# Patient Record
Sex: Female | Born: 1970 | Race: White | Hispanic: No | Marital: Married | Smoking: Current every day smoker
Health system: Southern US, Community
[De-identification: ages and names within clinical notes are randomized; demographics above are authoritative.]

## PROBLEM LIST (undated history)

## (undated) DIAGNOSIS — Z9889 Other specified postprocedural states: Secondary | ICD-10-CM

## (undated) DIAGNOSIS — M797 Fibromyalgia: Secondary | ICD-10-CM

## (undated) DIAGNOSIS — D649 Anemia, unspecified: Secondary | ICD-10-CM

## (undated) DIAGNOSIS — F32A Depression, unspecified: Secondary | ICD-10-CM

## (undated) DIAGNOSIS — B1920 Unspecified viral hepatitis C without hepatic coma: Secondary | ICD-10-CM

## (undated) DIAGNOSIS — J449 Chronic obstructive pulmonary disease, unspecified: Secondary | ICD-10-CM

## (undated) DIAGNOSIS — I499 Cardiac arrhythmia, unspecified: Secondary | ICD-10-CM

## (undated) DIAGNOSIS — J189 Pneumonia, unspecified organism: Secondary | ICD-10-CM

## (undated) DIAGNOSIS — R112 Nausea with vomiting, unspecified: Secondary | ICD-10-CM

## (undated) DIAGNOSIS — E271 Primary adrenocortical insufficiency: Secondary | ICD-10-CM

## (undated) DIAGNOSIS — R519 Headache, unspecified: Secondary | ICD-10-CM

## (undated) DIAGNOSIS — F419 Anxiety disorder, unspecified: Secondary | ICD-10-CM

## (undated) DIAGNOSIS — K219 Gastro-esophageal reflux disease without esophagitis: Secondary | ICD-10-CM

## (undated) HISTORY — PX: ORTHOPEDIC SURGERY: SHX850

## (undated) HISTORY — PX: TYMPANOSTOMY TUBE PLACEMENT: SHX32

## (undated) HISTORY — PX: CHOLECYSTECTOMY: SHX55

## (undated) HISTORY — DX: Unspecified viral hepatitis C without hepatic coma: B19.20

## (undated) HISTORY — PX: TUBAL LIGATION: SHX77

---

## 2004-02-20 ENCOUNTER — Emergency Department: Payer: Self-pay | Admitting: Emergency Medicine

## 2004-02-20 ENCOUNTER — Other Ambulatory Visit: Payer: Self-pay

## 2004-08-09 ENCOUNTER — Emergency Department: Payer: Self-pay | Admitting: Emergency Medicine

## 2005-11-01 ENCOUNTER — Ambulatory Visit: Payer: Self-pay | Admitting: Physician Assistant

## 2005-12-08 ENCOUNTER — Ambulatory Visit: Payer: Self-pay | Admitting: Unknown Physician Specialty

## 2005-12-09 ENCOUNTER — Emergency Department: Payer: Self-pay | Admitting: Internal Medicine

## 2006-05-18 ENCOUNTER — Emergency Department: Payer: Self-pay | Admitting: Emergency Medicine

## 2006-07-12 ENCOUNTER — Emergency Department: Payer: Self-pay | Admitting: Emergency Medicine

## 2006-07-12 ENCOUNTER — Other Ambulatory Visit: Payer: Self-pay

## 2006-07-31 ENCOUNTER — Ambulatory Visit: Payer: Self-pay | Admitting: Gastroenterology

## 2006-09-23 ENCOUNTER — Emergency Department (HOSPITAL_COMMUNITY): Admission: EM | Admit: 2006-09-23 | Discharge: 2006-09-23 | Payer: Self-pay | Admitting: Emergency Medicine

## 2007-04-02 ENCOUNTER — Encounter: Admission: RE | Admit: 2007-04-02 | Discharge: 2007-04-02 | Payer: Self-pay

## 2007-06-24 ENCOUNTER — Emergency Department: Payer: Self-pay | Admitting: Emergency Medicine

## 2007-06-24 ENCOUNTER — Emergency Department (HOSPITAL_COMMUNITY): Admission: EM | Admit: 2007-06-24 | Discharge: 2007-06-24 | Payer: Self-pay | Admitting: Emergency Medicine

## 2007-06-27 ENCOUNTER — Emergency Department: Payer: Self-pay | Admitting: Emergency Medicine

## 2007-06-28 ENCOUNTER — Emergency Department: Payer: Self-pay | Admitting: Emergency Medicine

## 2007-07-02 ENCOUNTER — Emergency Department (HOSPITAL_COMMUNITY): Admission: EM | Admit: 2007-07-02 | Discharge: 2007-07-02 | Payer: Self-pay | Admitting: Emergency Medicine

## 2007-07-21 ENCOUNTER — Emergency Department: Payer: Self-pay | Admitting: Emergency Medicine

## 2007-10-05 ENCOUNTER — Emergency Department: Payer: Self-pay | Admitting: Emergency Medicine

## 2007-11-02 ENCOUNTER — Emergency Department: Payer: Self-pay | Admitting: Emergency Medicine

## 2007-12-06 ENCOUNTER — Other Ambulatory Visit: Payer: Self-pay

## 2007-12-06 ENCOUNTER — Emergency Department: Payer: Self-pay | Admitting: Emergency Medicine

## 2008-01-08 ENCOUNTER — Emergency Department: Payer: Self-pay | Admitting: Emergency Medicine

## 2008-01-29 ENCOUNTER — Emergency Department (HOSPITAL_COMMUNITY): Admission: EM | Admit: 2008-01-29 | Discharge: 2008-01-30 | Payer: Self-pay | Admitting: Emergency Medicine

## 2008-01-31 ENCOUNTER — Emergency Department (HOSPITAL_COMMUNITY): Admission: EM | Admit: 2008-01-31 | Discharge: 2008-02-01 | Payer: Self-pay | Admitting: Emergency Medicine

## 2008-02-25 ENCOUNTER — Emergency Department (HOSPITAL_COMMUNITY): Admission: EM | Admit: 2008-02-25 | Discharge: 2008-02-26 | Payer: Self-pay | Admitting: Emergency Medicine

## 2008-02-27 ENCOUNTER — Emergency Department (HOSPITAL_COMMUNITY): Admission: EM | Admit: 2008-02-27 | Discharge: 2008-02-27 | Payer: Self-pay | Admitting: Emergency Medicine

## 2008-03-01 ENCOUNTER — Emergency Department: Payer: Self-pay | Admitting: Emergency Medicine

## 2008-03-26 ENCOUNTER — Emergency Department (HOSPITAL_COMMUNITY): Admission: EM | Admit: 2008-03-26 | Discharge: 2008-03-26 | Payer: Self-pay | Admitting: Emergency Medicine

## 2008-04-10 ENCOUNTER — Ambulatory Visit: Payer: Self-pay | Admitting: Internal Medicine

## 2008-04-10 DIAGNOSIS — I080 Rheumatic disorders of both mitral and aortic valves: Secondary | ICD-10-CM | POA: Insufficient documentation

## 2008-04-10 DIAGNOSIS — J449 Chronic obstructive pulmonary disease, unspecified: Secondary | ICD-10-CM | POA: Insufficient documentation

## 2008-04-10 DIAGNOSIS — E785 Hyperlipidemia, unspecified: Secondary | ICD-10-CM | POA: Insufficient documentation

## 2008-04-10 DIAGNOSIS — J45909 Unspecified asthma, uncomplicated: Secondary | ICD-10-CM | POA: Insufficient documentation

## 2008-04-10 DIAGNOSIS — IMO0001 Reserved for inherently not codable concepts without codable children: Secondary | ICD-10-CM | POA: Insufficient documentation

## 2008-04-10 DIAGNOSIS — R079 Chest pain, unspecified: Secondary | ICD-10-CM | POA: Insufficient documentation

## 2008-04-10 DIAGNOSIS — M797 Fibromyalgia: Secondary | ICD-10-CM | POA: Insufficient documentation

## 2008-05-30 ENCOUNTER — Ambulatory Visit: Payer: Self-pay | Admitting: Internal Medicine

## 2008-05-30 DIAGNOSIS — R609 Edema, unspecified: Secondary | ICD-10-CM | POA: Insufficient documentation

## 2008-07-31 ENCOUNTER — Emergency Department: Payer: Self-pay | Admitting: Unknown Physician Specialty

## 2008-08-01 ENCOUNTER — Emergency Department: Payer: Self-pay | Admitting: Emergency Medicine

## 2008-08-01 ENCOUNTER — Emergency Department (HOSPITAL_COMMUNITY): Admission: EM | Admit: 2008-08-01 | Discharge: 2008-08-02 | Payer: Self-pay | Admitting: Emergency Medicine

## 2008-08-19 ENCOUNTER — Ambulatory Visit: Payer: Self-pay | Admitting: Internal Medicine

## 2008-08-22 DIAGNOSIS — J209 Acute bronchitis, unspecified: Secondary | ICD-10-CM | POA: Insufficient documentation

## 2008-09-03 ENCOUNTER — Observation Stay: Payer: Self-pay | Admitting: *Deleted

## 2008-09-17 ENCOUNTER — Emergency Department: Payer: Self-pay | Admitting: Emergency Medicine

## 2008-10-06 ENCOUNTER — Observation Stay: Payer: Self-pay | Admitting: Internal Medicine

## 2008-10-11 ENCOUNTER — Ambulatory Visit: Payer: Self-pay | Admitting: Family Medicine

## 2008-12-15 ENCOUNTER — Emergency Department: Payer: Self-pay | Admitting: Emergency Medicine

## 2008-12-16 ENCOUNTER — Emergency Department: Payer: Self-pay | Admitting: Unknown Physician Specialty

## 2008-12-24 IMAGING — CR DG CHEST 2V
2 series · 2 of 2 positions shown · non-contrast
Comparison: 02/27/2008

CLINICAL DATA: Chest pain.  Wheezing.  Asthma.

CHEST - 2 VIEW

[w chest pa]
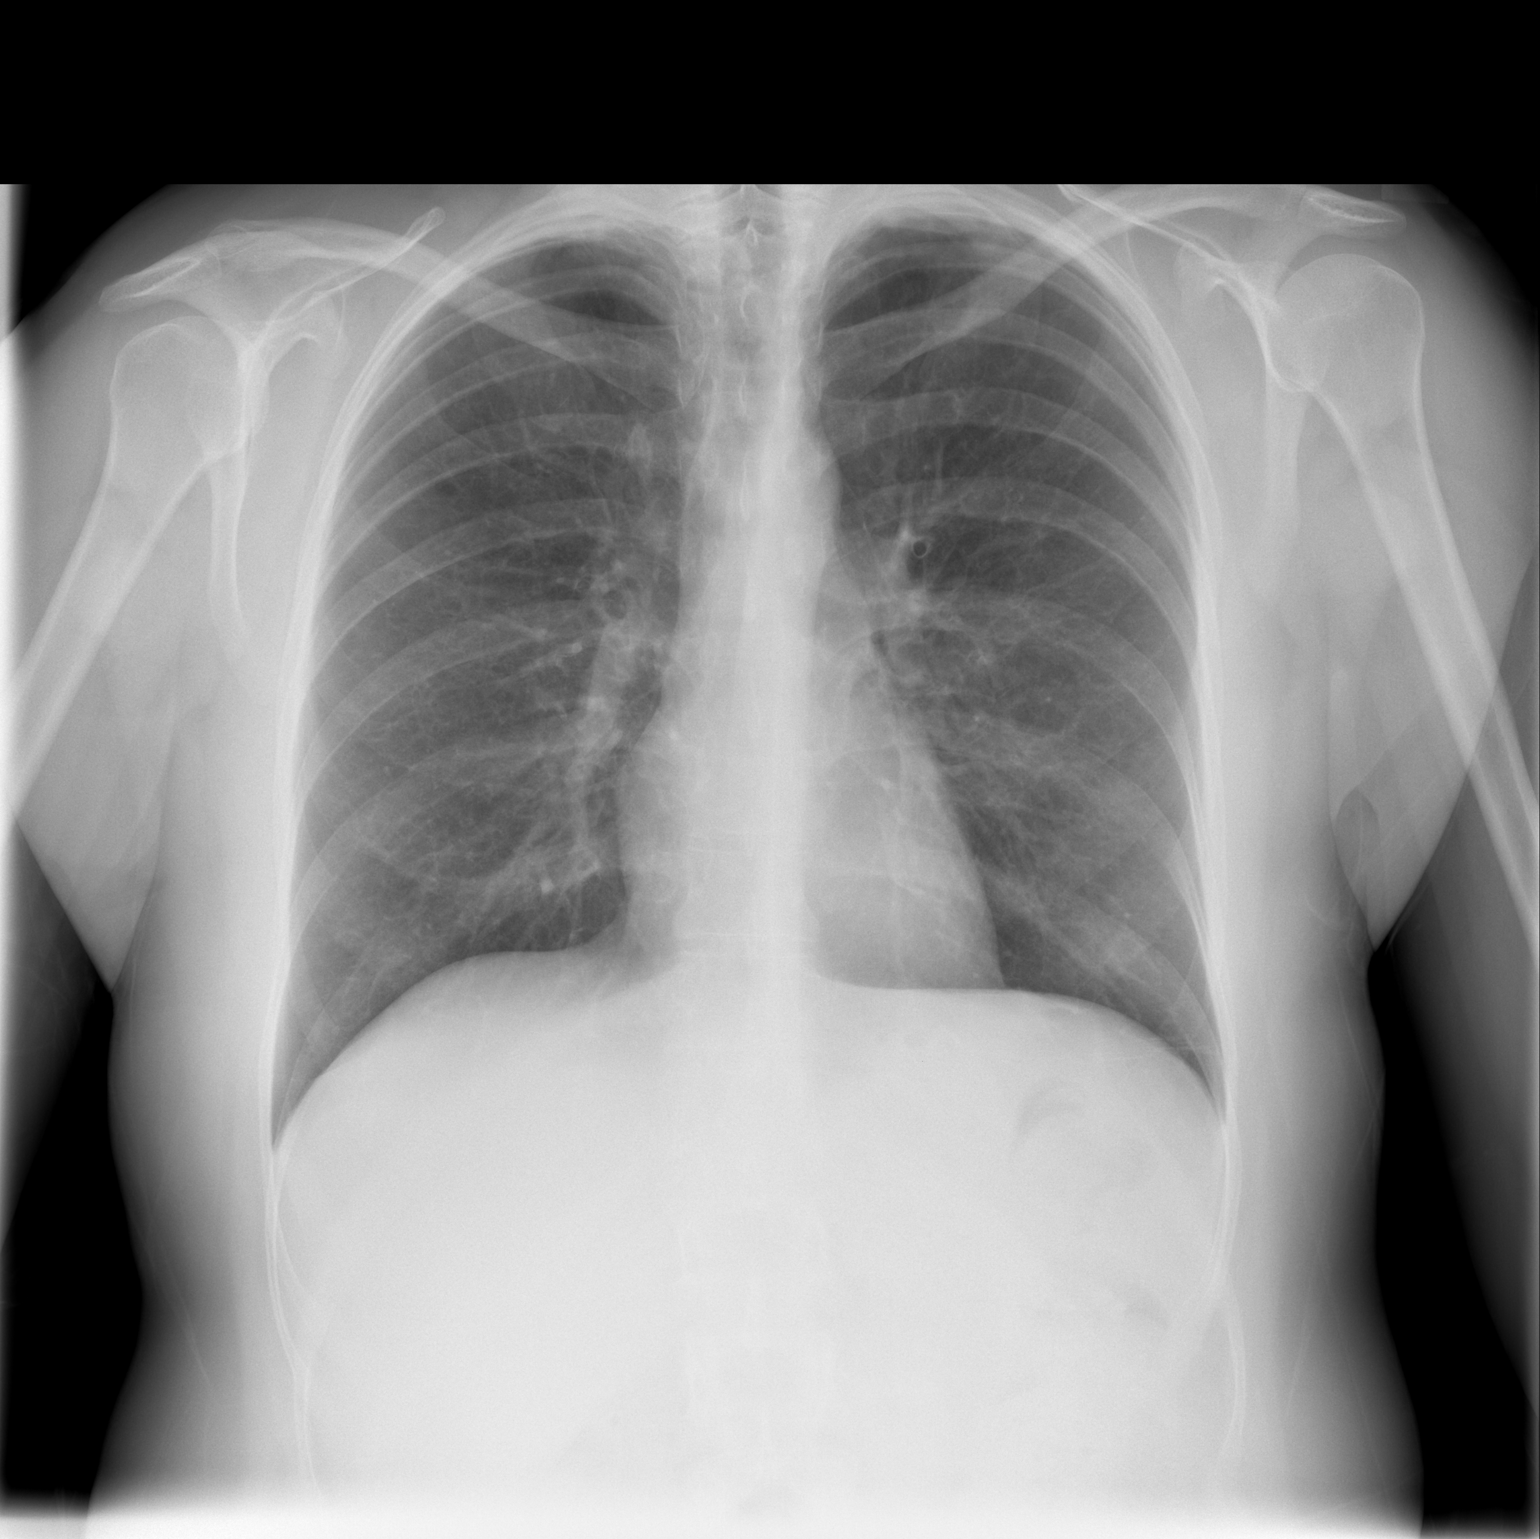

[w chest lat]
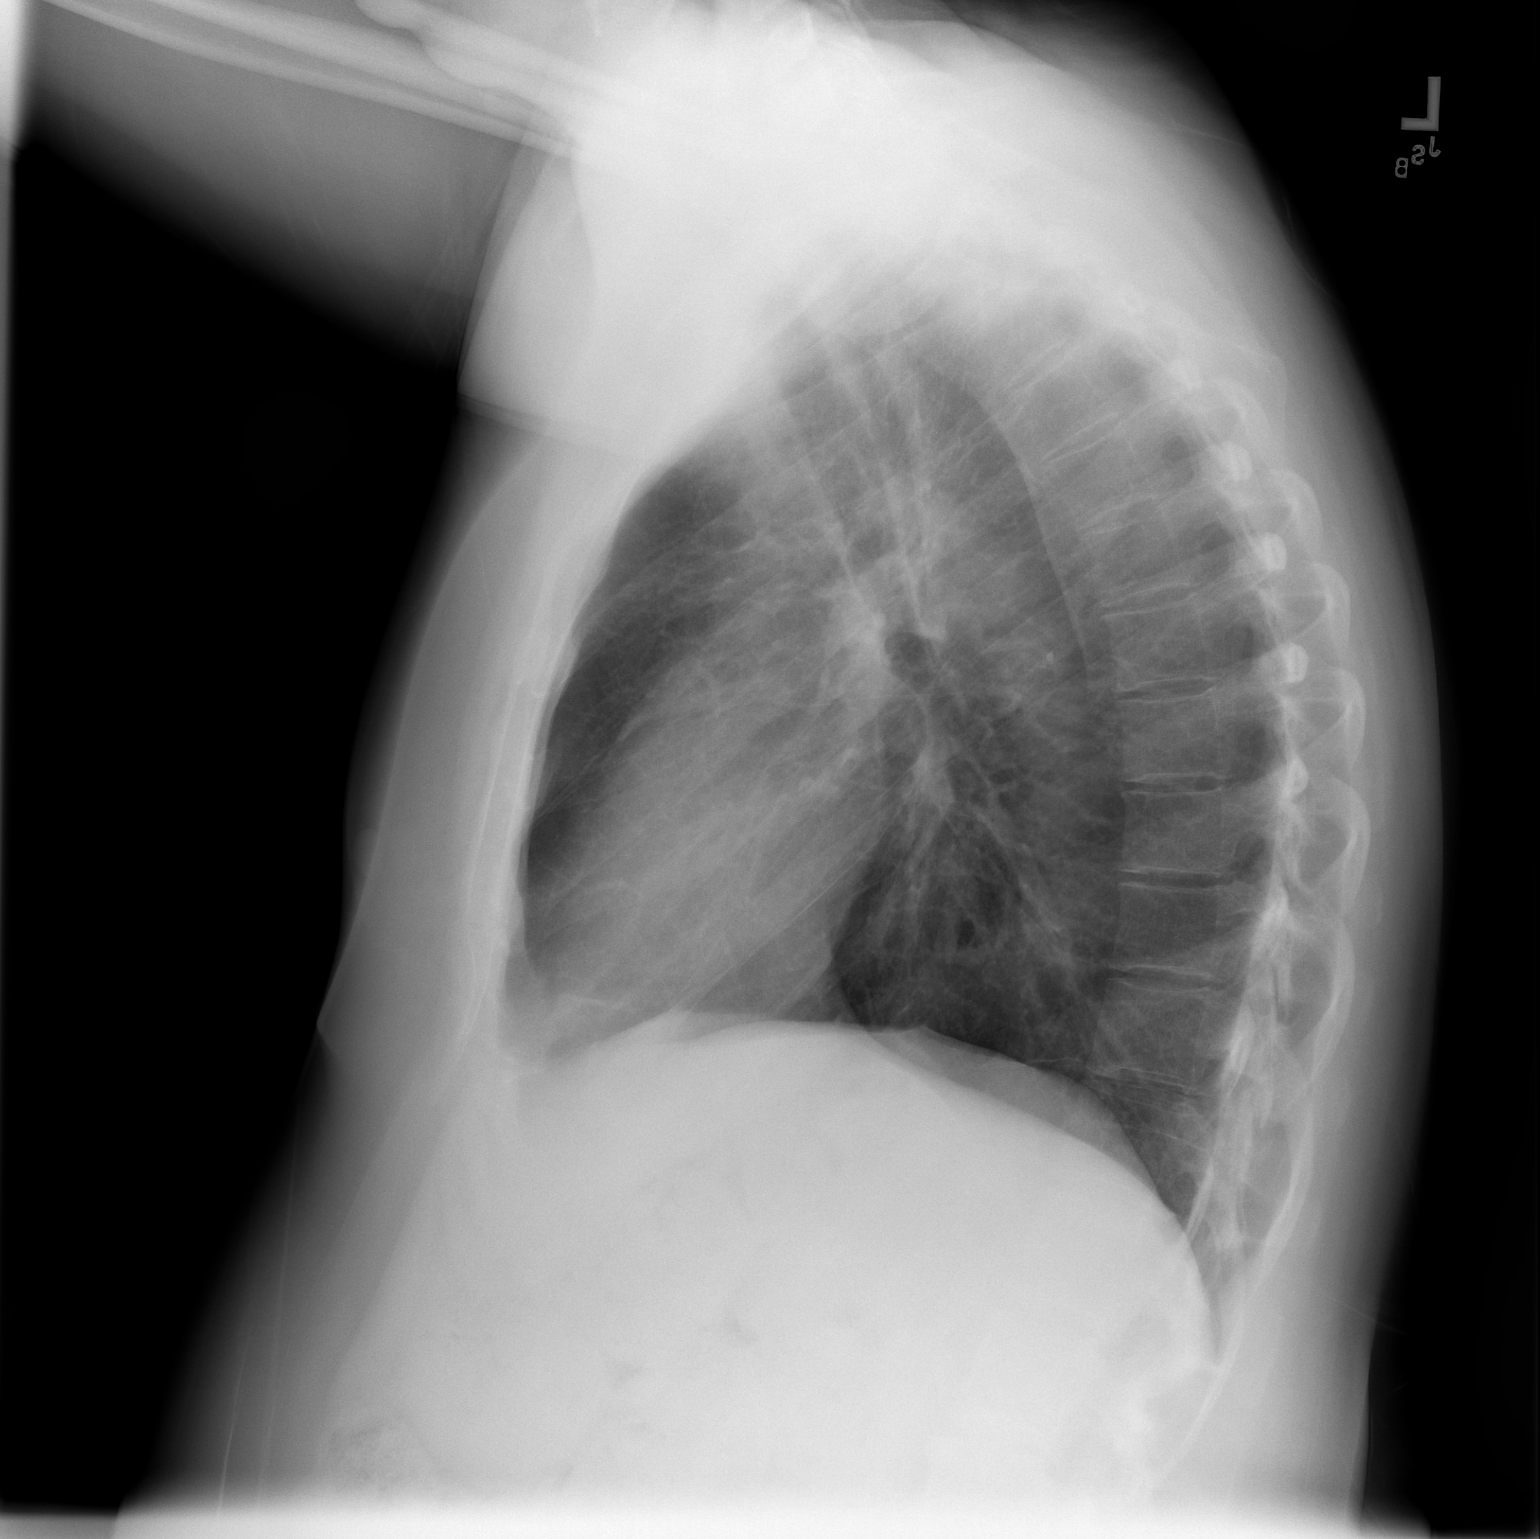

[2 of 2 positions shown; findings below may reference images not displayed]

FINDINGS: The heart size and mediastinal contours are within normal
limits.  Both lungs are clear.  The visualized skeletal structures
are unremarkable.
IMPRESSION: No active cardiopulmonary disease.

## 2009-01-15 ENCOUNTER — Emergency Department: Payer: Self-pay | Admitting: Emergency Medicine

## 2009-01-18 ENCOUNTER — Emergency Department: Payer: Self-pay | Admitting: Emergency Medicine

## 2009-01-20 ENCOUNTER — Ambulatory Visit: Payer: Self-pay | Admitting: General Surgery

## 2009-01-24 ENCOUNTER — Emergency Department: Payer: Self-pay | Admitting: Surgery

## 2009-02-17 ENCOUNTER — Inpatient Hospital Stay: Payer: Self-pay | Admitting: Psychiatry

## 2009-05-21 ENCOUNTER — Observation Stay: Payer: Self-pay | Admitting: Internal Medicine

## 2009-05-26 ENCOUNTER — Emergency Department: Payer: Self-pay | Admitting: Unknown Physician Specialty

## 2010-07-14 LAB — COMPREHENSIVE METABOLIC PANEL
ALT: 10 U/L (ref 0–35)
CO2: 29 mEq/L (ref 19–32)
Chloride: 102 mEq/L (ref 96–112)
Creatinine, Ser: 0.78 mg/dL (ref 0.4–1.2)
GFR calc Af Amer: >60 mL/min (ref >60)
GFR calc non Af Amer: >60 mL/min (ref >60)
Glucose, Bld: 111 mg/dL — ABNORMAL HIGH (ref 70–99)
Potassium: 3.7 mEq/L (ref 3.5–5.1)
Total Protein: 5.5 g/dL — ABNORMAL LOW (ref 6.0–8.3)

## 2010-07-14 LAB — URINALYSIS, ROUTINE W REFLEX MICROSCOPIC
Bilirubin Urine: NEGATIVE
Hgb urine dipstick: NEGATIVE
Protein, ur: NEGATIVE mg/dL
Specific Gravity, Urine: 1.009 (ref 1.005–1.030)

## 2010-07-14 LAB — CBC
HCT: 34.7 % — ABNORMAL LOW (ref 36.0–46.0)
MCV: 86.9 fL (ref 78.0–100.0)
Platelets: 284 10*3/uL (ref 150–400)
RDW: 14.8 % (ref 11.5–15.5)
WBC: 7.6 10*3/uL (ref 4.0–10.5)

## 2010-11-28 ENCOUNTER — Emergency Department: Payer: Self-pay | Admitting: Emergency Medicine

## 2011-01-04 LAB — CBC
HCT: 35.7 — ABNORMAL LOW
Hemoglobin: 12.7
MCHC: 35.5
MCV: 93.1
MCV: 94.2
Platelets: 258
Platelets: 259
Platelets: 256
RBC: 3.72 — ABNORMAL LOW
RDW: 12.9
RDW: 13.2
WBC: 10.5

## 2011-01-04 LAB — PREGNANCY, URINE: Preg Test, Ur: NEGATIVE

## 2011-01-04 LAB — DIFFERENTIAL
Basophils Absolute: 0.1
Basophils Absolute: 0.1
Basophils Relative: 1
Basophils Relative: 1
Eosinophils Absolute: 0.3
Eosinophils Absolute: 0.3
Eosinophils Relative: 4
Eosinophils Relative: 3
Lymphocytes Relative: 28
Lymphocytes Relative: 33
Lymphs Abs: 2.8
Lymphs Abs: 3
Monocytes Absolute: 0.6
Monocytes Absolute: 0.6
Monocytes Absolute: 0.6
Monocytes Absolute: 0.7
Monocytes Relative: 5
Neutro Abs: 6.5
Neutro Abs: 7.5
Neutro Abs: 9.7 — ABNORMAL HIGH
Neutrophils Relative %: 56

## 2011-01-04 LAB — URINALYSIS, ROUTINE W REFLEX MICROSCOPIC
Bilirubin Urine: NEGATIVE
Hgb urine dipstick: NEGATIVE
Ketones, ur: NEGATIVE
Nitrite: NEGATIVE
Protein, ur: NEGATIVE
Protein, ur: NEGATIVE
Specific Gravity, Urine: 1.005
Urobilinogen, UA: 0.2
Urobilinogen, UA: 0.2
pH: 6.5

## 2011-01-04 LAB — POCT I-STAT, CHEM 8
BUN: 6
Calcium, Ion: 1.1 — ABNORMAL LOW
Chloride: 108
Creatinine, Ser: 0.8
Creatinine, Ser: 0.8
Glucose, Bld: 99
Glucose, Bld: 93
HCT: 34 — ABNORMAL LOW
Hemoglobin: 11.6 — ABNORMAL LOW
Hemoglobin: 12.2
Potassium: 4.1
Potassium: 3.8
Sodium: 140
TCO2: 25

## 2011-01-04 LAB — COMPREHENSIVE METABOLIC PANEL
ALT: 22
ALT: 12
AST: 19
Albumin: 2.9 — ABNORMAL LOW
Albumin: 3.2 — ABNORMAL LOW
Alkaline Phosphatase: 70
Alkaline Phosphatase: 70
Alkaline Phosphatase: 78
BUN: 9
CO2: 26
CO2: 28
Calcium: 9.4
Chloride: 105
GFR calc Af Amer: >60
GFR calc Af Amer: >60
GFR calc Af Amer: >60
Glucose, Bld: 101 — ABNORMAL HIGH
Potassium: 3.8
Potassium: 4.2
Potassium: 3.9
Sodium: 139
Sodium: 137
Total Bilirubin: 0.4
Total Protein: 5.4 — ABNORMAL LOW
Total Protein: 5.9 — ABNORMAL LOW

## 2011-01-04 LAB — POCT PREGNANCY, URINE
Preg Test, Ur: NEGATIVE
Preg Test, Ur: NEGATIVE

## 2011-01-04 LAB — WET PREP, GENITAL
Trich, Wet Prep: NONE SEEN
WBC, Wet Prep HPF POC: NONE SEEN
Yeast Wet Prep HPF POC: NONE SEEN

## 2011-01-04 LAB — URINE MICROSCOPIC-ADD ON

## 2011-01-04 LAB — LIPASE, BLOOD: Lipase: 21

## 2011-01-07 LAB — POCT I-STAT, CHEM 8
Chloride: 106 mEq/L (ref 96–112)
HCT: 39 % (ref 36.0–46.0)
Hemoglobin: 13.3 g/dL (ref 12.0–15.0)
Potassium: 4.1 mEq/L (ref 3.5–5.1)

## 2011-01-07 LAB — DIFFERENTIAL
Basophils Absolute: 0.1 10*3/uL (ref 0.0–0.1)
Lymphocytes Relative: 36 % (ref 12–46)
Neutro Abs: 3.9 10*3/uL (ref 1.7–7.7)
Neutrophils Relative %: 54 % (ref 43–77)

## 2011-01-07 LAB — URINE MICROSCOPIC-ADD ON

## 2011-01-07 LAB — URINALYSIS, ROUTINE W REFLEX MICROSCOPIC
Ketones, ur: NEGATIVE mg/dL
Nitrite: NEGATIVE
Specific Gravity, Urine: 1.014 (ref 1.005–1.030)
Urobilinogen, UA: 0.2 mg/dL (ref 0.0–1.0)
pH: 6.5 (ref 5.0–8.0)

## 2011-01-07 LAB — D-DIMER, QUANTITATIVE: D-Dimer, Quant: 0.34 ug/mL-FEU (ref 0.00–0.48)

## 2011-01-07 LAB — CBC
Platelets: 274 10*3/uL (ref 150–400)
RDW: 13.5 % (ref 11.5–15.5)

## 2011-10-25 ENCOUNTER — Emergency Department (HOSPITAL_COMMUNITY)
Admission: EM | Admit: 2011-10-25 | Discharge: 2011-10-26 | Disposition: A | Discharge status: Home / Self Care | Payer: PRIVATE HEALTH INSURANCE | Attending: Emergency Medicine | Admitting: Emergency Medicine

## 2011-10-25 ENCOUNTER — Emergency Department (HOSPITAL_COMMUNITY): Payer: PRIVATE HEALTH INSURANCE

## 2011-10-25 ENCOUNTER — Encounter (HOSPITAL_COMMUNITY): Payer: Self-pay | Admitting: *Deleted

## 2011-10-25 DIAGNOSIS — R17 Unspecified jaundice: Secondary | ICD-10-CM | POA: Insufficient documentation

## 2011-10-25 DIAGNOSIS — E2749 Other adrenocortical insufficiency: Secondary | ICD-10-CM | POA: Insufficient documentation

## 2011-10-25 DIAGNOSIS — IMO0001 Reserved for inherently not codable concepts without codable children: Secondary | ICD-10-CM | POA: Insufficient documentation

## 2011-10-25 DIAGNOSIS — K759 Inflammatory liver disease, unspecified: Secondary | ICD-10-CM | POA: Insufficient documentation

## 2011-10-25 DIAGNOSIS — F17200 Nicotine dependence, unspecified, uncomplicated: Secondary | ICD-10-CM | POA: Insufficient documentation

## 2011-10-25 HISTORY — DX: Fibromyalgia: M79.7

## 2011-10-25 HISTORY — DX: Primary adrenocortical insufficiency: E27.1

## 2011-10-25 LAB — CBC WITH DIFFERENTIAL/PLATELET
Eosinophils Relative: 5 % (ref 0–5)
HCT: 41.3 % (ref 36.0–46.0)
Hemoglobin: 14.4 g/dL (ref 12.0–15.0)
Lymphocytes Relative: 34 % (ref 12–46)
Lymphs Abs: 2.5 10*3/uL (ref 0.7–4.0)
MCV: 88.2 fL (ref 78.0–100.0)
Monocytes Absolute: 0.5 10*3/uL (ref 0.1–1.0)
Monocytes Relative: 7 % (ref 3–12)
Neutro Abs: 3.8 10*3/uL (ref 1.7–7.7)
RBC: 4.68 MIL/uL (ref 3.87–5.11)
RDW: 14.4 % (ref 11.5–15.5)
WBC: 7.4 10*3/uL (ref 4.0–10.5)

## 2011-10-25 LAB — URINALYSIS, ROUTINE W REFLEX MICROSCOPIC
Glucose, UA: 100 mg/dL — AB
Hgb urine dipstick: NEGATIVE
Leukocytes, UA: NEGATIVE
Protein, ur: NEGATIVE mg/dL
pH: 5.5 (ref 5.0–8.0)

## 2011-10-25 LAB — COMPREHENSIVE METABOLIC PANEL
AST: 637 U/L — ABNORMAL HIGH (ref 0–37)
BUN: 9 mg/dL (ref 6–23)
CO2: 29 mEq/L (ref 19–32)
Calcium: 8.6 mg/dL (ref 8.4–10.5)
Chloride: 101 mEq/L (ref 96–112)
Creatinine, Ser: 0.59 mg/dL (ref 0.50–1.10)
GFR calc Af Amer: >90 mL/min (ref >90)
GFR calc non Af Amer: >90 mL/min (ref >90)
Glucose, Bld: 107 mg/dL — ABNORMAL HIGH (ref 70–99)
Total Bilirubin: 6.6 mg/dL — ABNORMAL HIGH (ref 0.3–1.2)

## 2011-10-25 LAB — POCT PREGNANCY, URINE: Preg Test, Ur: NEGATIVE

## 2011-10-25 LAB — LIPASE, BLOOD: Lipase: 28 U/L (ref 11–59)

## 2011-10-25 MED ORDER — ONDANSETRON 8 MG PO TBDP: 8.0000 mg | ORAL_TABLET | Freq: Three times a day (TID) | ORAL | Status: AC | PRN

## 2011-10-25 MED ORDER — IOHEXOL 300 MG/ML  SOLN
100.0000 mL | Freq: Once | INTRAMUSCULAR | Status: AC | PRN
  Administered 2011-10-25: 100 mL via INTRAVENOUS

## 2011-10-25 MED ORDER — ONDANSETRON 8 MG PO TBDP
8.0000 mg | ORAL_TABLET | Freq: Once | ORAL | Status: AC
  Administered 2011-10-25: 8 mg via ORAL
  Filled 2011-10-25: qty 1

## 2011-10-25 NOTE — ED Provider Notes (Signed)
History     CSN: [id number]  Arrival date & time 10/25/11  2002   First MD Initiated Contact with Patient 10/25/11 2038      Chief Complaint  Patient presents with  . Emesis     Patient is a 41 y.o. female presenting with vomiting. The history is provided by the patient.  Emesis  This is a chronic problem. The current episode started more than 1 week ago. Episode frequency: several times per day. The problem has been gradually worsening. The emesis has an appearance of stomach contents. There has been no fever. Associated symptoms include abdominal pain. Pertinent negatives include no diarrhea and no fever.  pt presents for vomiting She reports vomiting on/off for at least 3 months on most days Denies cp/sob She reports some abdominal discomfort and feels like food is "getting caught" No diarrhea but reports constipation Denies hematemesis No rectal bleeding No fever No weight loss or night sweats Denies ETOH use Denies drug use  Past Medical History  Diagnosis Date  . Fibromyalgia   . Addison disease     Past Surgical History  Procedure Date  . Orthopedic surgery   . Cholecystectomy     No family history on file.  History  Substance Use Topics  . Smoking status: Current Everyday Smoker -- 1.0 packs/day    Types: Cigarettes  . Smokeless tobacco: Not on file  . Alcohol Use: No    OB History    Grav Para Term Preterm Abortions TAB SAB Ect Mult Living                  Review of Systems  Constitutional: Negative for fever.  Gastrointestinal: Positive for vomiting and abdominal pain. Negative for diarrhea.  All other systems reviewed and are negative.    Allergies  Review of patient's allergies indicates no known allergies.  Home Medications  No current outpatient prescriptions on file.  BP 121/66  Pulse 96  Temp 98.1 F (36.7 C) (Oral)  Resp 20  Ht 5\' 3"  (1.6 m)  Wt 138 lb (62.596 kg)  BMI 24.45 kg/m2  SpO2 100%  LMP 10/19/2011  Physical  Exam CONSTITUTIONAL: Well developed/well nourished HEAD AND FACE: Normocephalic/atraumatic EYES: EOMI/PERRL.  Scleral icterus noted ENMT: Mucous membranes moist NECK: supple no meningeal signs SPINE:entire spine nontender CV: S1/S2 noted, no murmurs/rubs/gallops noted LUNGS: Lungs are clear to auscultation bilaterally, no apparent distress ABDOMEN: soft, nontender, no rebound or guarding GU:no cva tenderness NEURO: Pt is awake/alert, moves all extremitiesx4 EXTREMITIES: pulses normal, full ROM SKIN: warm, color normal PSYCH: no abnormalities of mood noted  ED Course  Procedures    Labs Reviewed  POCT PREGNANCY, URINE  URINALYSIS, ROUTINE W REFLEX MICROSCOPIC  CBC WITH DIFFERENTIAL  COMPREHENSIVE METABOLIC PANEL  LIPASE, BLOOD  pt with recurrent vomiting for several months She does have scleral icterus Will check labs and reassess   9:56 PM Pt here with vomiting but normal abdominal exam.  She does have scleral icterus.   D/w dr Patria Mane to f/u on labs and reassess She is s/p cholecystecomy.  Denies etoh/drug use She does have PCP.  MDM  Nursing notes including past medical history and social history reviewed and considered in documentation labs/vitals reviewed and considered         Date: 10/25/2011  Rate: 85  Rhythm: normal sinus rhythm  QRS Axis: normal  Intervals: normal  ST/T Wave abnormalities: normal  Conduction Disutrbances:none     Joya Gaskins, MD 10/25/11 2158

## 2011-10-25 NOTE — ED Notes (Signed)
Patient also states that it is hard for her to swallow, that sometimes it feels like her food is getting caught.

## 2011-10-25 NOTE — ED Notes (Signed)
Vomiting for 3 months

## 2011-10-25 NOTE — ED Provider Notes (Signed)
11:50 PM The patient is feeling better at this time.  She does have elevation of her liver enzymes.  She has a primary care physician and will followup with the gastroenterologist.  Her symptoms have been persistent for 2 months.  Her CT scan demonstrates no acute radiographic abnormalities.  She was discharged home with a prescription for nausea medicine and instructions to orally hydrate.  She understands the importance of close PCP and GI followup.  Specifically she does not drink alcohol.  She denies use of Tylenol directly or Tylenol in other medications.   1. Hepatitis   2. Jaundice    Results for orders placed during the hospital encounter of 10/25/11  URINALYSIS, ROUTINE W REFLEX MICROSCOPIC      Component Value Range   Color, Urine AMBER (*) YELLOW   APPearance CLEAR  CLEAR   Specific Gravity, Urine >1.030 (*) 1.005 - 1.030   pH 5.5  5.0 - 8.0   Glucose, UA 100 (*) NEGATIVE mg/dL   Hgb urine dipstick NEGATIVE  NEGATIVE   Bilirubin Urine MODERATE (*) NEGATIVE   Ketones, ur NEGATIVE  NEGATIVE mg/dL   Protein, ur NEGATIVE  NEGATIVE mg/dL   Urobilinogen, UA 1.0  0.0 - 1.0 mg/dL   Nitrite NEGATIVE  NEGATIVE   Leukocytes, UA NEGATIVE  NEGATIVE  CBC WITH DIFFERENTIAL      Component Value Range   WBC 7.4  4.0 - 10.5 K/uL   RBC 4.68  3.87 - 5.11 MIL/uL   Hemoglobin 14.4  12.0 - 15.0 g/dL   HCT 16.1  09.6 - 04.5 %   MCV 88.2  78.0 - 100.0 fL   MCH 30.8  26.0 - 34.0 pg   MCHC 34.9  30.0 - 36.0 g/dL   RDW 40.9  81.1 - 91.4 %   Platelets 229  150 - 400 K/uL   Neutrophils Relative 52  43 - 77 %   Neutro Abs 3.8  1.7 - 7.7 K/uL   Lymphocytes Relative 34  12 - 46 %   Lymphs Abs 2.5  0.7 - 4.0 K/uL   Monocytes Relative 7  3 - 12 %   Monocytes Absolute 0.5  0.1 - 1.0 K/uL   Eosinophils Relative 5  0 - 5 %   Eosinophils Absolute 0.4  0.0 - 0.7 K/uL   Basophils Relative 2 (*) 0 - 1 %   Basophils Absolute 0.1  0.0 - 0.1 K/uL  COMPREHENSIVE METABOLIC PANEL      Component Value Range   Sodium 137  135 - 145 mEq/L   Potassium 3.5  3.5 - 5.1 mEq/L   Chloride 101  96 - 112 mEq/L   CO2 29  19 - 32 mEq/L   Glucose, Bld 107 (*) 70 - 99 mg/dL   BUN 9  6 - 23 mg/dL   Creatinine, Ser 7.82  0.50 - 1.10 mg/dL   Calcium 8.6  8.4 - 95.6 mg/dL   Total Protein 6.5  6.0 - 8.3 g/dL   Albumin 2.8 (*) 3.5 - 5.2 g/dL   AST 213 (*) 0 - 37 U/L   ALT 495 (*) 0 - 35 U/L   Alkaline Phosphatase 202 (*) 39 - 117 U/L   Total Bilirubin 6.6 (*) 0.3 - 1.2 mg/dL   GFR calc non Af Amer >90  >90 mL/min   GFR calc Af Amer >90  >90 mL/min  LIPASE, BLOOD      Component Value Range   Lipase 28  11 - 59  U/L  POCT PREGNANCY, URINE      Component Value Range   Preg Test, Ur NEGATIVE  NEGATIVE     Lyanne Co, MD 10/25/11 2351

## 2011-10-27 ENCOUNTER — Ambulatory Visit (HOSPITAL_COMMUNITY)
Admission: RE | Admit: 2011-10-27 | Discharge: 2011-10-27 | Disposition: A | Discharge status: Home / Self Care | Payer: PRIVATE HEALTH INSURANCE | Source: Ambulatory Visit | Attending: Internal Medicine | Admitting: Internal Medicine

## 2011-10-27 ENCOUNTER — Encounter (INDEPENDENT_AMBULATORY_CARE_PROVIDER_SITE_OTHER): Payer: Self-pay | Admitting: Internal Medicine

## 2011-10-27 ENCOUNTER — Telehealth (INDEPENDENT_AMBULATORY_CARE_PROVIDER_SITE_OTHER): Payer: Self-pay | Admitting: Internal Medicine

## 2011-10-27 ENCOUNTER — Ambulatory Visit (INDEPENDENT_AMBULATORY_CARE_PROVIDER_SITE_OTHER): Payer: PRIVATE HEALTH INSURANCE | Admitting: Internal Medicine

## 2011-10-27 VITALS — BP 94/60 | HR 76 | Temp 98.2°F | Ht 64.0 in | Wt 136.0 lb

## 2011-10-27 DIAGNOSIS — R748 Abnormal levels of other serum enzymes: Secondary | ICD-10-CM

## 2011-10-27 DIAGNOSIS — R7402 Elevation of levels of lactic acid dehydrogenase (LDH): Secondary | ICD-10-CM

## 2011-10-27 DIAGNOSIS — R17 Unspecified jaundice: Secondary | ICD-10-CM | POA: Insufficient documentation

## 2011-10-27 DIAGNOSIS — E271 Primary adrenocortical insufficiency: Secondary | ICD-10-CM | POA: Insufficient documentation

## 2011-10-27 LAB — COMPREHENSIVE METABOLIC PANEL
BUN: 10 mg/dL (ref 6–23)
CO2: 28 mEq/L (ref 19–32)
Calcium: 8.6 mg/dL (ref 8.4–10.5)
Chloride: 102 mEq/L (ref 96–112)
Creat: 0.66 mg/dL (ref 0.50–1.10)
Glucose, Bld: 83 mg/dL (ref 70–99)

## 2011-10-27 LAB — PROTIME-INR
INR: 0.97 (ref <1.50)
Prothrombin Time: 13.3 seconds (ref 11.6–15.2)

## 2011-10-27 LAB — HEPATITIS PANEL, ACUTE: Hepatitis B Surface Ag: NEGATIVE

## 2011-10-27 LAB — SEDIMENTATION RATE: Sed Rate: 8 mm/hr (ref 0–22)

## 2011-10-27 NOTE — Progress Notes (Signed)
Subjective:     Patient ID: Kathleen Mcpherson, female   DOB: 1970/10/06, 41 y.o.   MRN: [id number]  HPI [name] is a 41o yr old female referred to our office for jaundice.  She has nausea and vomiting x 3 months. She thought she had a virus.  She saw [place] Pain Dr. [name] and she had a fever of 102.  She tells me her bowel movements are horrible.  She has a BM x 1 every 2 weeks and are hard as a rock.  When she swallows she has a squeezing sensation in her chest. There has been no weight loss.  She says she aches all over from her Fibromyalgia. She has epigastric pain. She says her urine looks like tea. She was seen in the ED and noted to have elevated liver enzymes. She has frequent indigestion. Slight rt upper quadrant pain. No IV drugs use. No tattoos.  She is in a monogamous relationship.  She was put on Morphine x 1 month. Symptoms started before starting the Morphine. She has not been out of the country. No recent antibiotics.  She has been taking Xanax 1mg  about 5-6 a day x 1 week.   CT abdomen/pelvis with CM:IMPRESSION:  No acute abnormalities identified. Moderate sized hiatal hernia   CMP     Component Value Date/Time   NA 137 10/25/2011 2128   K 3.5 10/25/2011 2128   CL 101 10/25/2011 2128   CO2 29 10/25/2011 2128   GLUCOSE 107* 10/25/2011 2128   BUN 9 10/25/2011 2128   CREATININE 0.59 10/25/2011 2128   CALCIUM 8.6 10/25/2011 2128   PROT 6.5 10/25/2011 2128   ALBUMIN 2.8* 10/25/2011 2128   AST 637* 10/25/2011 2128   ALT 495* 10/25/2011 2128   ALKPHOS 202* 10/25/2011 2128   BILITOT 6.6* 10/25/2011 2128   GFRNONAA >90 10/25/2011 2128   GFRAA >90 10/25/2011 2128   CBC    Component Value Date/Time   WBC 7.4 10/25/2011 2128   RBC 4.68 10/25/2011 2128   HGB 14.4 10/25/2011 2128   HCT 41.3 10/25/2011 2128   PLT 229 10/25/2011 2128   MCV 88.2 10/25/2011 2128   MCH 30.8 10/25/2011 2128   MCHC 34.9 10/25/2011 2128   RDW 14.4 10/25/2011 2128   LYMPHSABS 2.5 10/25/2011 2128   MONOABS 0.5 10/25/2011 2128     EOSABS 0.4 10/25/2011 2128   BASOSABS 0.1 10/25/2011 2128    Urinalysis    Component Value Date/Time   COLORURINE AMBER* 10/25/2011 2109   APPEARANCEUR CLEAR 10/25/2011 2109   LABSPEC >1.030* 10/25/2011 2109   PHURINE 5.5 10/25/2011 2109   GLUCOSEU 100* 10/25/2011 2109   HGBUR NEGATIVE 10/25/2011 2109   BILIRUBINUR MODERATE* 10/25/2011 2109   KETONESUR NEGATIVE 10/25/2011 2109   PROTEINUR NEGATIVE 10/25/2011 2109   UROBILINOGEN 1.0 10/25/2011 2109   NITRITE NEGATIVE 10/25/2011 2109   LEUKOCYTESUR NEGATIVE 10/25/2011 2109       Review of Systems see hpi Current Outpatient Prescriptions  Medication Sig Dispense Refill  . albuterol (PROAIR HFA) 108 (90 BASE) MCG/ACT inhaler Inhale 2 puffs into the lungs every 6 (six) hours as needed.      Marland Kitchen albuterol (PROVENTIL) (2.5 MG/3ML) 0.083% nebulizer solution Take 2.5 mg by nebulization every 6 (six) hours as needed.      . ALPRAZolam (XANAX) 1 MG tablet Take 1 mg by mouth 4 (four) times daily.      . calcium carbonate (TUMS) 500 MG chewable tablet Chew 1-2 tablets by mouth every  2 (two) hours as needed.      . carisoprodol (SOMA) 350 MG tablet Take 350 mg by mouth 2 (two) times daily.      . DULoxetine (CYMBALTA) 60 MG capsule Take 60 mg by mouth daily.      . fludrocortisone (FLORINEF) 0.1 MG tablet Take 0.1 mg by mouth daily.      Marland Kitchen loratadine (CLARITIN) 10 MG tablet Take 10 mg by mouth daily.      Marland Kitchen morphine (MS CONTIN) 15 MG 12 hr tablet Take 15 mg by mouth 3 (three) times daily.      Marland Kitchen omeprazole (PRILOSEC) 20 MG capsule Take 20 mg by mouth daily.      . promethazine (PHENERGAN) 25 MG tablet Take 25 mg by mouth 4 (four) times daily as needed.      . ranitidine (ZANTAC) 150 MG tablet Take 150 mg by mouth as needed.      . ondansetron (ZOFRAN-ODT) 8 MG disintegrating tablet Take 1 tablet (8 mg total) by mouth every 8 (eight) hours as needed for nausea.  20 tablet  0  . UNKNOWN TO PATIENT Inhale 1 puff into the lungs every 12 (twelve) hours.  INHALER: NAME UNKNOWN       Current Outpatient Prescriptions on File Prior to Visit  Medication Sig Dispense Refill  . albuterol (PROAIR HFA) 108 (90 BASE) MCG/ACT inhaler Inhale 2 puffs into the lungs every 6 (six) hours as needed.      Marland Kitchen albuterol (PROVENTIL) (2.5 MG/3ML) 0.083% nebulizer solution Take 2.5 mg by nebulization every 6 (six) hours as needed.      . ALPRAZolam (XANAX) 1 MG tablet Take 1 mg by mouth 4 (four) times daily.      . calcium carbonate (TUMS) 500 MG chewable tablet Chew 1-2 tablets by mouth every 2 (two) hours as needed.      . carisoprodol (SOMA) 350 MG tablet Take 350 mg by mouth 2 (two) times daily.      . DULoxetine (CYMBALTA) 60 MG capsule Take 60 mg by mouth daily.      Marland Kitchen loratadine (CLARITIN) 10 MG tablet Take 10 mg by mouth daily.      Marland Kitchen morphine (MS CONTIN) 15 MG 12 hr tablet Take 15 mg by mouth 3 (three) times daily.      Marland Kitchen omeprazole (PRILOSEC) 20 MG capsule Take 20 mg by mouth daily.      . promethazine (PHENERGAN) 25 MG tablet Take 25 mg by mouth 4 (four) times daily as needed.      . ranitidine (ZANTAC) 150 MG tablet Take 150 mg by mouth as needed.      . ondansetron (ZOFRAN-ODT) 8 MG disintegrating tablet Take 1 tablet (8 mg total) by mouth every 8 (eight) hours as needed for nausea.  20 tablet  0  . UNKNOWN TO PATIENT Inhale 1 puff into the lungs every 12 (twelve) hours. INHALER: NAME UNKNOWN       Past Medical History  Diagnosis Date  . Fibromyalgia   . Addison disease    Past Surgical History  Procedure Date  . Orthopedic surgery     knee surgery  . Cholecystectomy    Family Status  Relation Status Death Age  . Mother Alive     good health. HTN  . Father Deceased     Compolications of DM Type 1  . Sister Alive     Hep C. Fibromyaligia., osteoporosis.   History   Social History  . Marital  Status: Single    Spouse Name: N/A    Number of Children: N/A  . Years of Education: N/A   Occupational History  . Not on file.   Social  History Main Topics  . Smoking status: Current Everyday Smoker -- 1.0 packs/day    Types: Cigarettes  . Smokeless tobacco: Not on file   Comment: 1 pack a day.  . Alcohol Use: No  . Drug Use: No     In the past, she smoke cocaine and marijuana. No IV drugs.  . Sexually Active: Not on file   Other Topics Concern  . Not on file   Social History Narrative  . No narrative on file   No Known Allergies      Objective:   Physical Examvitamin  Filed Vitals:   10/27/11 0913  Height: 5\' 4"  (1.626 m)  Weight: 136 lb (61.689 kg)   Alert and oriented. Skin warm and dry. Oral mucosa is moist.   . Sclera icteric, conjunctivae is pink. Thyroid not enlarged. No cervical lymphadenopathy. Lungs clear. Heart regular rate and rhythm.  Abdomen is soft. Bowel sounds are positive. No hepatomegaly. No abdominal masses felt. No tenderness.  No edema to lower extremities.        Assessment:   Jaundice. Elevated liver enzymes. Hx of prior cholecystectomy.  Symptoms x 3 months. No risk factors for Hepatitis C. I discussed with Dr Karilyn Cota.    Plan:    cmet, PT/INR, ANA, AMA, sed rate, US abdomen. Further recommendations to follow. Patient was advised if symptoms worsened, to go to the ED.

## 2011-10-27 NOTE — Patient Instructions (Addendum)
Stat lab work to include cmet, ama, ANA, INR, cmet, and US abdomen. Further recommendations to follow.

## 2011-10-27 NOTE — Telephone Encounter (Signed)
Needs Hep C RNA Quant. Results of lab given to patient and [place].    Kathleen Mcpherson, she needs a Hep C RNA Quant.   Kathleen Mcpherson, She needs an OV in 2 weeks.

## 2011-10-28 ENCOUNTER — Telehealth (INDEPENDENT_AMBULATORY_CARE_PROVIDER_SITE_OTHER): Payer: Self-pay | Admitting: *Deleted

## 2011-10-28 DIAGNOSIS — R7401 Elevation of levels of liver transaminase levels: Secondary | ICD-10-CM

## 2011-10-28 DIAGNOSIS — R17 Unspecified jaundice: Secondary | ICD-10-CM

## 2011-10-28 LAB — ANA: Anti Nuclear Antibody(ANA): NEGATIVE

## 2011-10-28 NOTE — Telephone Encounter (Signed)
Apt has been scheduled for 11/10/11 at 9:30 am with Dorene Ar, NP. Naesha has been advised to get labs drawn on 11/07/11 and voices understood.

## 2011-10-28 NOTE — Telephone Encounter (Signed)
Lab order faxed to [facility] Lab Patient per Kathleen Mcpherson need an appointment in 2 weeks Lupita Leash)

## 2011-10-28 NOTE — Telephone Encounter (Signed)
Per Dorene Ar ,NP the patient will need to have Hep C RNA Quant. Lab noted and faxed to the [employer]

## 2011-11-10 ENCOUNTER — Ambulatory Visit (INDEPENDENT_AMBULATORY_CARE_PROVIDER_SITE_OTHER): Payer: PRIVATE HEALTH INSURANCE | Admitting: Internal Medicine

## 2011-11-10 ENCOUNTER — Other Ambulatory Visit (INDEPENDENT_AMBULATORY_CARE_PROVIDER_SITE_OTHER): Payer: Self-pay | Admitting: *Deleted

## 2011-11-10 ENCOUNTER — Encounter (INDEPENDENT_AMBULATORY_CARE_PROVIDER_SITE_OTHER): Payer: Self-pay | Admitting: Internal Medicine

## 2011-11-10 ENCOUNTER — Encounter (INDEPENDENT_AMBULATORY_CARE_PROVIDER_SITE_OTHER): Payer: Self-pay | Admitting: *Deleted

## 2011-11-10 VITALS — BP 102/72 | HR 76 | Temp 98.1°F | Ht 64.0 in | Wt 133.1 lb

## 2011-11-10 DIAGNOSIS — B1920 Unspecified viral hepatitis C without hepatic coma: Secondary | ICD-10-CM

## 2011-11-10 DIAGNOSIS — K921 Melena: Secondary | ICD-10-CM | POA: Insufficient documentation

## 2011-11-10 LAB — CBC
MCH: 30.3 pg (ref 26.0–34.0)
MCHC: 34.1 g/dL (ref 30.0–36.0)
MCV: 89.1 fL (ref 78.0–100.0)
Platelets: 326 10*3/uL (ref 150–400)
RBC: 4.68 MIL/uL (ref 3.87–5.11)
RDW: 14.9 % (ref 11.5–15.5)

## 2011-11-10 LAB — AFP TUMOR MARKER: AFP-Tumor Marker: 5.6 ng/mL (ref 0.0–8.0)

## 2011-11-10 NOTE — Patient Instructions (Addendum)
Labs for Hepatitis C. EGD for melena. [facility] for Hep A and B immunization. Bronchure on Hepatitis C. EGD with Dr. Karilyn Cota to rule PUD. NO NSAIDS.

## 2011-11-10 NOTE — Progress Notes (Signed)
Subjective:     Patient ID: Kathleen Mcpherson, female   DOB: 1970-09-30, 41 y.o.   MRN: [id number]  HPITeresa is a 41 yr old female here today for follow up for jaundice. She had had nausea and vomiting x 3 months. She thought she had a virus.  She also c/o fever. She presented to the ED and noted to have elevated liver enzymes.   . No IV drug use and no tattoos. She is in a monogamous relationship. No recent antibiotics. Noted to have a positive Hep C antibody. Hep C Quaint on 8/25 was 9736. She tells me she would like treatment for her Hepatitis C. She tells me her appetite is not good.  She is forcing down pudding. Anything cold she can eat. Today she c/o epigastric pain and has frequent acid reflux x 3 months . Frequent nausea but .no vomiting. She is taking Zofran for her nausea.  Symptoms are worse at night. Her BMs are black . Two black stools today.  Stools are like  hard balls.  Has been taking Zantac for her stomach.  BC Powder x 2 doses yesterday. No other NSAIDs. She also c/o dizziness.  Sister has Hepatitis C  CT abdomen/pelvis with CM:IMPRESSION:  No acute abnormalities identified. Moderate sized hiatal hernia   Clinical Data: Jaundice  ABDOMINAL ULTRASOUND COMPLETE  Comparison: 10/25/2011  Findings:  Gallbladder: Prior cholecystectomy.  Common Bile Duct: Within normal limits in caliber. Measures 5.9  mm.  Liver: No focal mass lesion identified. Within normal limits in  parenchymal echogenicity.  IVC: Appears normal.  Pancreas: Obscured by overlying bowel gas.  Spleen: Within normal limits in size and echotexture.  Right kidney: Normal in size and parenchymal echogenicity. No  evidence of mass or hydronephrosis.  Left kidney: Normal in size and parenchymal echogenicity. No  evidence of mass or hydronephrosis.  Abdominal Aorta: No aneurysm identified.  IMPRESSION:  Negative abdominal ultrasound.  Original Report Authenticated By: Rosealee Albee, M.D.      Review of  Systems     Objective:   Physical Exam Filed Vitals:   11/10/11 0920  Height: 5\' 4"  (1.626 m)  Weight: 133 lb 1.6 oz (60.374 kg)   .     Alert and oriented. Skin warm and dry. Oral mucosa is moist.   . Sclera anicteric, conjunctivae is pink. Thyroid not enlarged. No cervical lymphadenopathy. Lungs clear. Heart regular rate and rhythm.  Abdomen is soft. Bowel sounds are positive. No hepatomegaly. No abdominal masses felt. MId abdominal and epigastric tenderness.  Stool marooned colored and guaiac positive.  No edema to lower extremities. Patient is alert and oriented.           Assessment:   Hepatitis C.  She feels much better now. No jaundice.  Constipation: probably narcotic induced. Melena: probably PUD.    Plan:    [facility] A and B vaccine. AFP, Genotype, PT/INR, CMET., CBC to be sure she is not anemia    EGD given her history of melena and guaiac positive stools. Omperazole BID   She will need a liver biopsy, but this will be put on hold for right now.

## 2011-11-11 LAB — COMPREHENSIVE METABOLIC PANEL
ALT: 88 U/L — ABNORMAL HIGH (ref 0–35)
CO2: 28 mEq/L (ref 19–32)
Creat: 0.67 mg/dL (ref 0.50–1.10)
Total Bilirubin: 1.1 mg/dL (ref 0.3–1.2)

## 2011-11-17 ENCOUNTER — Telehealth (INDEPENDENT_AMBULATORY_CARE_PROVIDER_SITE_OTHER): Payer: Self-pay | Admitting: *Deleted

## 2011-11-17 DIAGNOSIS — B1920 Unspecified viral hepatitis C without hepatic coma: Secondary | ICD-10-CM

## 2011-11-17 NOTE — Telephone Encounter (Signed)
Per Dorene Ar, Np the patient will need to have  Hep C Genotype

## 2011-11-21 ENCOUNTER — Encounter (HOSPITAL_COMMUNITY): Payer: Self-pay | Admitting: Pharmacy Technician

## 2011-11-21 ENCOUNTER — Telehealth (INDEPENDENT_AMBULATORY_CARE_PROVIDER_SITE_OTHER): Payer: Self-pay | Admitting: Internal Medicine

## 2011-11-21 NOTE — Telephone Encounter (Signed)
Please see result note 

## 2011-11-21 NOTE — Progress Notes (Signed)
Message left on home home.

## 2011-11-23 ENCOUNTER — Ambulatory Visit (HOSPITAL_COMMUNITY)
Admission: RE | Admit: 2011-11-23 | Discharge: 2011-11-23 | Disposition: A | Discharge status: Home / Self Care | Payer: PRIVATE HEALTH INSURANCE | Source: Ambulatory Visit | Attending: Internal Medicine | Admitting: Internal Medicine

## 2011-11-23 ENCOUNTER — Encounter (HOSPITAL_COMMUNITY)
Admission: RE | Disposition: A | Discharge status: Home / Self Care | Payer: Self-pay | Source: Ambulatory Visit | Attending: Internal Medicine

## 2011-11-23 ENCOUNTER — Encounter (HOSPITAL_COMMUNITY): Payer: Self-pay | Admitting: *Deleted

## 2011-11-23 DIAGNOSIS — K921 Melena: Secondary | ICD-10-CM

## 2011-11-23 DIAGNOSIS — K2100 Gastro-esophageal reflux disease with esophagitis, without bleeding: Secondary | ICD-10-CM | POA: Insufficient documentation

## 2011-11-23 DIAGNOSIS — K208 Other esophagitis without bleeding: Secondary | ICD-10-CM

## 2011-11-23 DIAGNOSIS — K2990 Gastroduodenitis, unspecified, without bleeding: Secondary | ICD-10-CM

## 2011-11-23 DIAGNOSIS — K449 Diaphragmatic hernia without obstruction or gangrene: Secondary | ICD-10-CM | POA: Insufficient documentation

## 2011-11-23 DIAGNOSIS — R1013 Epigastric pain: Secondary | ICD-10-CM | POA: Insufficient documentation

## 2011-11-23 DIAGNOSIS — K2289 Other specified disease of esophagus: Secondary | ICD-10-CM

## 2011-11-23 DIAGNOSIS — K2980 Duodenitis without bleeding: Secondary | ICD-10-CM | POA: Insufficient documentation

## 2011-11-23 DIAGNOSIS — K228 Other specified diseases of esophagus: Secondary | ICD-10-CM

## 2011-11-23 DIAGNOSIS — K2210 Ulcer of esophagus without bleeding: Secondary | ICD-10-CM | POA: Insufficient documentation

## 2011-11-23 DIAGNOSIS — Z7982 Long term (current) use of aspirin: Secondary | ICD-10-CM

## 2011-11-23 DIAGNOSIS — B1920 Unspecified viral hepatitis C without hepatic coma: Secondary | ICD-10-CM

## 2011-11-23 HISTORY — PX: ESOPHAGOGASTRODUODENOSCOPY: SHX5428

## 2011-11-23 SURGERY — EGD (ESOPHAGOGASTRODUODENOSCOPY)
Anesthesia: Moderate Sedation

## 2011-11-23 MED ORDER — OMEPRAZOLE 20 MG PO CPDR: 20.0000 mg | DELAYED_RELEASE_CAPSULE | Freq: Two times a day (BID) | ORAL | Status: DC

## 2011-11-23 MED ORDER — PANTOPRAZOLE SODIUM 40 MG PO TBEC: 40.0000 mg | DELAYED_RELEASE_TABLET | Freq: Two times a day (BID) | ORAL | Status: DC

## 2011-11-23 MED ORDER — MIDAZOLAM HCL 5 MG/5ML IJ SOLN
INTRAMUSCULAR | Status: DC | PRN
  Administered 2011-11-23: 3 mg via INTRAVENOUS
  Administered 2011-11-23 (×3): 2 mg via INTRAVENOUS
  Administered 2011-11-23: 3 mg via INTRAVENOUS

## 2011-11-23 MED ORDER — MEPERIDINE HCL 25 MG/ML IJ SOLN
INTRAMUSCULAR | Status: DC | PRN
  Administered 2011-11-23 (×2): 25 mg via INTRAVENOUS

## 2011-11-23 MED ORDER — SODIUM CHLORIDE 0.45 % IV SOLN
INTRAVENOUS | Status: DC
  Administered 2011-11-23: 1000 mL via INTRAVENOUS

## 2011-11-23 MED ORDER — MIDAZOLAM HCL 5 MG/5ML IJ SOLN
INTRAMUSCULAR | Status: AC
  Filled 2011-11-23: qty 5

## 2011-11-23 MED ORDER — STERILE WATER FOR IRRIGATION IR SOLN
Status: DC | PRN
  Administered 2011-11-23: 11:00:00

## 2011-11-23 MED ORDER — MIDAZOLAM HCL 5 MG/5ML IJ SOLN
INTRAMUSCULAR | Status: AC
  Filled 2011-11-23: qty 10

## 2011-11-23 MED ORDER — MEPERIDINE HCL 50 MG/ML IJ SOLN
INTRAMUSCULAR | Status: AC
  Filled 2011-11-23: qty 1

## 2011-11-23 NOTE — H&P (Signed)
Kathleen Mcpherson is an 41 y.o. female.   Chief Complaint: Patient is for esophagogastroduodenoscopy. HPI: Patient is 41 year old Caucasian female who was recently evaluated for icteric hepatitis and conjunctivae hepatitis C. He has been experiencing epigastric pain and had to tarry stools. Her stool was guaiac positive in the office. She's been taking Goody's and BC powder. She suspected have peptic ulcer disease. She is therefore undergoing diagnostic EGD. There is no history of peptic ulcer disease. She has lost 8 pounds with her acute illness. Her bilirubin is now normal in transaminases are down to below 100. AST was over 600 and ALT was around 500 weeks ago  Past Medical History  Diagnosis Date  . Fibromyalgia   . Addison disease   . Hepatitis C     Past Surgical History  Procedure Date  . Orthopedic surgery     knee surgery  . Cholecystectomy     History reviewed. No pertinent family history. Social History:  reports that she has been smoking Cigarettes.  She has been smoking about 1 pack per day. She does not have any smokeless tobacco history on file. She reports that she does not drink alcohol or use illicit drugs.  Allergies:  Allergies  Allergen Reactions  . Asa (Aspirin) Other (See Comments)    G.I.Upset  . Tylenol (Acetaminophen) Other (See Comments)    G.I. Upset    Medications Prior to Admission  Medication Sig Dispense Refill  . albuterol (PROVENTIL) (2.5 MG/3ML) 0.083% nebulizer solution Take 2.5 mg by nebulization every 6 (six) hours as needed.      . ALPRAZolam (XANAX) 1 MG tablet Take 1 mg by mouth 4 (four) times daily.      . budesonide-formoterol (SYMBICORT) 160-4.5 MCG/ACT inhaler Inhale 2 puffs into the lungs 2 (two) times daily.      . carisoprodol (SOMA) 350 MG tablet Take 350 mg by mouth 2 (two) times daily.      . DULoxetine (CYMBALTA) 60 MG capsule Take 60 mg by mouth daily.      . fludrocortisone (FLORINEF) 0.1 MG tablet Take 0.1 mg by mouth daily.      .  Linaclotide (LINZESS) 290 MCG CAPS Take 1 capsule by mouth daily.      Marland Kitchen morphine (MS CONTIN) 15 MG 12 hr tablet Take 15 mg by mouth 2 (two) times daily as needed. Pain      . morphine (MSIR) 15 MG tablet Take 15 mg by mouth 3 (three) times daily.      Marland Kitchen omeprazole (PRILOSEC) 20 MG capsule Take 20 mg by mouth daily.      . ondansetron (ZOFRAN-ODT) 8 MG disintegrating tablet Take 8 mg by mouth every 8 (eight) hours as needed. Nausea and Vomiting.      Marland Kitchen albuterol (PROAIR HFA) 108 (90 BASE) MCG/ACT inhaler Inhale 2 puffs into the lungs every 6 (six) hours as needed. Shortness of breath        No results found for this or any previous visit (from the past 48 hour(s)). No results found.  ROS  Blood pressure 117/68, pulse 71, temperature 98.4 F (36.9 C), temperature source Oral, resp. rate 16, height 5\' 4"  (1.626 m), weight 130 lb (58.968 kg), last menstrual period 11/16/2011, SpO2 96.00%. Physical Exam  Constitutional: She appears well-developed and well-nourished.  HENT:  Mouth/Throat: Oropharynx is clear and moist.  Eyes: Conjunctivae are normal. No scleral icterus.  Neck: No thyromegaly present.  Cardiovascular: Normal rate, regular rhythm and normal heart sounds.  No murmur heard. Respiratory: Effort normal and breath sounds normal.  GI: Soft. She exhibits no distension and no mass. There is Tenderness: mild tenderness at epigastrium and right lower quadrant..  Musculoskeletal: She exhibits no edema.  Lymphadenopathy:    She has no cervical adenopathy.  Neurological: She is alert.  Skin: Skin is warm and dry.     Assessment/Plan History of melena. Diagnostic EGD.  REHMAN,NAJEEB U 11/23/2011, 11:29 AM

## 2011-11-23 NOTE — Op Note (Signed)
EGD PROCEDURE REPORT  PATIENT:  Kathleen Mcpherson  MR#:  [id number] Birthdate:  16-Oct-1970, 41 y.o., female Endoscopist:  Dr. Malissa Hippo, MD Procedure Date: 11/23/2011  Procedure:   EGD  Indications:  Patient is 41 year old Caucasian female who was recently evaluated for headache hepatitis and diagnosed with hep C. She also gives history of melena epigastric pain frequent heartburn and regurgitation despite taking omeprazole twice a day. Her stool is guaiac positive in the office. Her H&H is normal. She is undergoing diagnostic EGD. She was using [facility] and Goody powder frequently but has stopped doing so.            Informed Consent:  The risks, benefits, alternatives & imponderables which include, but are not limited to, bleeding, infection, perforation, drug reaction and potential missed lesion have been reviewed.  The potential for biopsy, lesion removal, esophageal dilation, etc. have also been discussed.  Questions have been answered.  All parties agreeable.  Please see history & physical in medical record for more information.  Medications:  Demerol 50 mg IV Versed 12 mg IV Cetacaine spray topically for oropharyngeal anesthesia  Description of procedure:  The endoscope was introduced through the mouth and advanced to the second portion of the duodenum without difficulty or limitations. The mucosal surfaces were surveyed very carefully during advancement of the scope and upon withdrawal.  Findings:  Esophagus:  Mucosa of the proximal and middle third was normal. Scattered erosions in to ulcers involving distal 5 cm of the esophagus. GEJ:  36 cm Hiatus:  39 cm Stomach:  Stomach was empty and distended very well with insufflation. Folds in the proximal stomach were normal. Examination of mucosa at body and antrum was normal. There wall erythema and edema to pyloric channel which was patent. Angularis fundus and cardia were examined by retroflexing the scope and were normal. Duodenum:  Focal  edema and erythema noted to bulbar mucosa along with 2 erosions at the angle of the duodenum.  Therapeutic/Diagnostic Maneuvers Performed:  None  Complications:  None  Impression: Erosive/ulcerative reflux esophagitis. Small sliding hiatal hernia. Pyloric channel inflammation along with bulbar duodenitis. Suspect she may have been losing blood from her esophagus.  Recommendations:  Standard instructions given. Anti-reflux measures reinforced. Discontinue omeprazole as it is not working. Pantoprazole 40 mg by mouth twice a day. H. pylori serology. Office visit in 4 weeks.  Riddick Nuon U  11/23/2011  11:59 AM  CC: Dr. Evelene Croon, MD & Dr. Bonnetta Barry ref. provider found

## 2011-11-24 ENCOUNTER — Telehealth (INDEPENDENT_AMBULATORY_CARE_PROVIDER_SITE_OTHER): Payer: Self-pay | Admitting: Internal Medicine

## 2011-11-24 DIAGNOSIS — R110 Nausea: Secondary | ICD-10-CM

## 2011-11-24 LAB — H. PYLORI ANTIBODY, IGG: H Pylori IgG: <0.4 {ISR}

## 2011-11-24 MED ORDER — ONDANSETRON HCL 4 MG PO TABS: 4.0000 mg | ORAL_TABLET | Freq: Three times a day (TID) | ORAL | Status: AC | PRN

## 2011-11-24 NOTE — Telephone Encounter (Signed)
C/o nausea. Will call in  Zofran to her pharmacy

## 2011-11-25 ENCOUNTER — Encounter (HOSPITAL_COMMUNITY): Payer: Self-pay | Admitting: Internal Medicine

## 2011-11-29 NOTE — Progress Notes (Signed)
Will not need this lab.  Patient has Hepatitis C.

## 2011-11-29 NOTE — Progress Notes (Signed)
I have her genotype

## 2011-12-27 ENCOUNTER — Ambulatory Visit (INDEPENDENT_AMBULATORY_CARE_PROVIDER_SITE_OTHER): Payer: PRIVATE HEALTH INSURANCE | Admitting: Internal Medicine

## 2011-12-27 ENCOUNTER — Encounter (INDEPENDENT_AMBULATORY_CARE_PROVIDER_SITE_OTHER): Payer: Self-pay | Admitting: Internal Medicine

## 2011-12-27 VITALS — BP 90/60 | HR 72 | Temp 98.2°F | Ht 63.0 in | Wt 145.1 lb

## 2011-12-27 DIAGNOSIS — K219 Gastro-esophageal reflux disease without esophagitis: Secondary | ICD-10-CM

## 2011-12-27 DIAGNOSIS — B1920 Unspecified viral hepatitis C without hepatic coma: Secondary | ICD-10-CM

## 2011-12-27 LAB — COMPREHENSIVE METABOLIC PANEL
AST: 29 U/L (ref 0–37)
Alkaline Phosphatase: 101 U/L (ref 39–117)
BUN: 7 mg/dL (ref 6–23)
Calcium: 8.4 mg/dL (ref 8.4–10.5)
Chloride: 101 mEq/L (ref 96–112)
Creat: 0.62 mg/dL (ref 0.50–1.10)

## 2011-12-27 MED ORDER — AZITHROMYCIN 250 MG PO TABS: ORAL_TABLET | ORAL | Status: DC

## 2011-12-27 NOTE — Patient Instructions (Addendum)
CMET, Hepatits Quant. Continue Protonix BID. OV in 1 month with Dr. Karilyn Cota.

## 2011-12-27 NOTE — Progress Notes (Signed)
Subjective:     Patient ID: Kathleen Mcpherson, female   DOB: [birth year], 41 y.o.   MRN: [id number]  HPI Kathleen Mcpherson is here today for f/u after recently undergoing an EGD for GERD like symptoms. She tells me she is better. Her acid reflux is worse at night.  She is taking Protonix BID. H. Pylori was negative. Appetite is good. No weight loss. She has a recent hx of Hepatitis C.  She was seen in the ED in July and noted to be jaundiced. Hep C was positive. She is a genotype 2B. AFP 5.6 She also saw her PCP for bronchitis. She was placed on Levaquin x 7 days.  She tells me she has been running a fever of 101. No fever today.    11/23/2011 EGD for GERD and melena. Erosive/ulcerative reflux esophagitis.  Small sliding hiatal hernia.  Pyloric channel inflammation along with bulbar duodenitis.  Suspect she may have been losing blood from her esophagus.      Review of Systems see hpi Current Outpatient Prescriptions  Medication Sig Dispense Refill  . albuterol (PROAIR HFA) 108 (90 BASE) MCG/ACT inhaler Inhale 2 puffs into the lungs every 6 (six) hours as needed. Shortness of breath      . albuterol (PROVENTIL) (2.5 MG/3ML) 0.083% nebulizer solution Take 2.5 mg by nebulization every 6 (six) hours as needed.      . ALPRAZolam (XANAX) 1 MG tablet Take 1 mg by mouth 2 (two) times daily before a meal.       . budesonide-formoterol (SYMBICORT) 160-4.5 MCG/ACT inhaler Inhale 2 puffs into the lungs 2 (two) times daily.      . carisoprodol (SOMA) 350 MG tablet Take 350 mg by mouth 2 (two) times daily.      . DULoxetine (CYMBALTA) 60 MG capsule Take 60 mg by mouth daily.      . fludrocortisone (FLORINEF) 0.1 MG tablet Take 0.1 mg by mouth daily.      Marland Kitchen guaiFENesin-codeine (ROBITUSSIN AC) 100-10 MG/5ML syrup Take 5 mLs by mouth 3 (three) times daily as needed.      . Linaclotide (LINZESS) 290 MCG CAPS Take 1 capsule by mouth daily.      Marland Kitchen morphine (MS CONTIN) 15 MG 12 hr tablet Take 30 mg by mouth 2 (two) times  daily as needed. Pain      . ondansetron (ZOFRAN-ODT) 8 MG disintegrating tablet Take 8 mg by mouth every 8 (eight) hours as needed. Nausea and Vomiting.      Marland Kitchen oxycodone (OXY-IR) 5 MG capsule Take 5 mg by mouth 3 (three) times daily.      . pantoprazole (PROTONIX) 40 MG tablet Take 1 tablet (40 mg total) by mouth 2 (two) times daily.  60 tablet  5   Past Medical History  Diagnosis Date  . Fibromyalgia   . Addison disease   . Hepatitis C    Past Surgical History  Procedure Date  . Orthopedic surgery     knee surgery  . Cholecystectomy   . Esophagogastroduodenoscopy 11/23/2011    Procedure: ESOPHAGOGASTRODUODENOSCOPY (EGD);  Surgeon: Malissa Hippo, MD;  Location: [facility];  Service: Endoscopy;  Laterality: N/A;  3:25   History   Social History  . Marital Status: Single    Spouse Name: N/A    Number of Children: N/A  . Years of Education: N/A   Occupational History  . Not on file.   Social History Main Topics  . Smoking status: Current Every Day Smoker --  1.0 packs/day    Types: Cigarettes  . Smokeless tobacco: Not on file   Comment: 1 pack a day.  . Alcohol Use: No  . Drug Use: No     In the past, she smoke cocaine and marijuana. No IV drugs.  . Sexually Active: Not on file   Other Topics Concern  . Not on file   Social History Narrative  . No narrative on file   Family Status  Relation Status Death Age  . Mother Alive     good health. HTN  . Father Deceased     Compolications of DM Type 1  . Sister Alive     Hep C. Fibromyaligia., osteoporosis.   Allergies  Allergen Reactions  . Asa (Aspirin) Other (See Comments)    G.I.Upset  . Tylenol (Acetaminophen) Other (See Comments)    G.I. Upset        Objective:   Physical Exam Filed Vitals:   12/27/11 1016  BP: 90/60  Pulse: 72  Temp: 98.2 F (36.8 C)  Height: 5\' 3"  (1.6 m)  Weight: 145 lb 1.6 oz (65.817 kg)  Appears sedated this am Alert and oriented. Skin warm and dry. Oral mucosa is moist.    . Sclera ? icteric, conjunctivae is pink. Thyroid not enlarged. No cervical lymphadenopathy. Bilateral wheezes. Heart regular rate and rhythm.  Abdomen is soft. Bowel sounds are positive. No hepatomegaly. No abdominal masses felt. No tenderness.  No edema to lower extremities. Patient is alert and oriented.      Assessment:    Hepatitis C, new onset. Recent hx of erosive esophagitis noted on EGD.   Bronchitis. Has been treated with Levaquin but is still running a fever with bilateral wheezes. Plan:     cmet today, Hep C quantitative, Urine drug screen Continue the Protonix.  OV in 1 months with Dr Karilyn Cota to discuss possible  liver biopsy  Z Pak eprescribed to pharmacy.

## 2011-12-28 LAB — DRUG SCREEN, URINE
Amphetamine Screen, Ur: NEGATIVE
Barbiturate Quant, Ur: NEGATIVE
Cocaine Metabolites: NEGATIVE

## 2011-12-28 LAB — HEPATITIS C RNA QUANTITATIVE: HCV Quantitative: 148452 IU/mL — ABNORMAL HIGH (ref <15)

## 2012-01-31 ENCOUNTER — Ambulatory Visit (INDEPENDENT_AMBULATORY_CARE_PROVIDER_SITE_OTHER): Payer: PRIVATE HEALTH INSURANCE | Admitting: Internal Medicine

## 2012-05-08 ENCOUNTER — Ambulatory Visit (INDEPENDENT_AMBULATORY_CARE_PROVIDER_SITE_OTHER): Payer: PRIVATE HEALTH INSURANCE | Admitting: Internal Medicine

## 2015-05-27 ENCOUNTER — Emergency Department (HOSPITAL_COMMUNITY)
Admission: EM | Admit: 2015-05-27 | Discharge: 2015-05-27 | Disposition: A | Discharge status: Home / Self Care | Payer: Medicare PPO | Source: Home / Self Care | Attending: Emergency Medicine | Admitting: Emergency Medicine

## 2015-05-27 ENCOUNTER — Emergency Department (HOSPITAL_COMMUNITY): Payer: Medicare PPO

## 2015-05-27 ENCOUNTER — Encounter (HOSPITAL_COMMUNITY): Payer: Self-pay | Admitting: Emergency Medicine

## 2015-05-27 DIAGNOSIS — Z8739 Personal history of other diseases of the musculoskeletal system and connective tissue: Secondary | ICD-10-CM | POA: Insufficient documentation

## 2015-05-27 DIAGNOSIS — J449 Chronic obstructive pulmonary disease, unspecified: Secondary | ICD-10-CM | POA: Diagnosis present

## 2015-05-27 DIAGNOSIS — Z8619 Personal history of other infectious and parasitic diseases: Secondary | ICD-10-CM

## 2015-05-27 DIAGNOSIS — B1920 Unspecified viral hepatitis C without hepatic coma: Secondary | ICD-10-CM | POA: Diagnosis present

## 2015-05-27 DIAGNOSIS — L03116 Cellulitis of left lower limb: Secondary | ICD-10-CM | POA: Insufficient documentation

## 2015-05-27 DIAGNOSIS — Z8639 Personal history of other endocrine, nutritional and metabolic disease: Secondary | ICD-10-CM | POA: Insufficient documentation

## 2015-05-27 DIAGNOSIS — Z79899 Other long term (current) drug therapy: Secondary | ICD-10-CM | POA: Insufficient documentation

## 2015-05-27 DIAGNOSIS — G629 Polyneuropathy, unspecified: Secondary | ICD-10-CM | POA: Diagnosis present

## 2015-05-27 DIAGNOSIS — M79672 Pain in left foot: Secondary | ICD-10-CM | POA: Diagnosis not present

## 2015-05-27 DIAGNOSIS — F419 Anxiety disorder, unspecified: Secondary | ICD-10-CM | POA: Diagnosis present

## 2015-05-27 DIAGNOSIS — R6520 Severe sepsis without septic shock: Secondary | ICD-10-CM | POA: Diagnosis present

## 2015-05-27 DIAGNOSIS — N179 Acute kidney failure, unspecified: Secondary | ICD-10-CM | POA: Diagnosis present

## 2015-05-27 DIAGNOSIS — K219 Gastro-esophageal reflux disease without esophagitis: Secondary | ICD-10-CM | POA: Diagnosis present

## 2015-05-27 DIAGNOSIS — F17210 Nicotine dependence, cigarettes, uncomplicated: Secondary | ICD-10-CM

## 2015-05-27 DIAGNOSIS — L02612 Cutaneous abscess of left foot: Secondary | ICD-10-CM | POA: Diagnosis present

## 2015-05-27 DIAGNOSIS — M797 Fibromyalgia: Secondary | ICD-10-CM | POA: Diagnosis present

## 2015-05-27 DIAGNOSIS — A419 Sepsis, unspecified organism: Secondary | ICD-10-CM | POA: Diagnosis not present

## 2015-05-27 LAB — BASIC METABOLIC PANEL
ANION GAP: 9 (ref 5–15)
BUN: 8 mg/dL (ref 6–20)
CALCIUM: 9.4 mg/dL (ref 8.9–10.3)
CO2: 29 mmol/L (ref 22–32)
Chloride: 103 mmol/L (ref 101–111)
Creatinine, Ser: 0.7 mg/dL (ref 0.44–1.00)
GFR calc Af Amer: >60 mL/min (ref >60)
GLUCOSE: 111 mg/dL — AB (ref 65–99)
Potassium: 4.2 mmol/L (ref 3.5–5.1)
SODIUM: 141 mmol/L (ref 135–145)

## 2015-05-27 LAB — CBC WITH DIFFERENTIAL/PLATELET
BASOS ABS: 0.1 10*3/uL (ref 0.0–0.1)
Basophils Relative: 0 %
EOS ABS: 0.2 10*3/uL (ref 0.0–0.7)
EOS PCT: 1 %
HCT: 46.4 % — ABNORMAL HIGH (ref 36.0–46.0)
Hemoglobin: 16.5 g/dL — ABNORMAL HIGH (ref 12.0–15.0)
LYMPHS PCT: 17 %
Lymphs Abs: 3 10*3/uL (ref 0.7–4.0)
MCH: 31.8 pg (ref 26.0–34.0)
MCHC: 35.6 g/dL (ref 30.0–36.0)
MCV: 89.4 fL (ref 78.0–100.0)
MONO ABS: 0.7 10*3/uL (ref 0.1–1.0)
Monocytes Relative: 4 %
Neutro Abs: 13.3 10*3/uL — ABNORMAL HIGH (ref 1.7–7.7)
Neutrophils Relative %: 78 %
PLATELETS: 306 10*3/uL (ref 150–400)
RBC: 5.19 MIL/uL — AB (ref 3.87–5.11)
RDW: 12.4 % (ref 11.5–15.5)
WBC: 17.3 10*3/uL — AB (ref 4.0–10.5)

## 2015-05-27 MED ORDER — HYDROMORPHONE HCL 1 MG/ML IJ SOLN: 1.0000 mg | Freq: Once | INTRAMUSCULAR | Status: DC

## 2015-05-27 MED ORDER — CLINDAMYCIN HCL 300 MG PO CAPS
300.0000 mg | ORAL_CAPSULE | Freq: Four times a day (QID) | ORAL | Status: DC
Start: 2015-05-27 — End: 2015-05-27

## 2015-05-27 MED ORDER — SODIUM CHLORIDE 0.9 % IV BOLUS (SEPSIS): 1000.0000 mL | Freq: Once | INTRAVENOUS | Status: DC

## 2015-05-27 MED ORDER — CLINDAMYCIN HCL 150 MG PO CAPS
300.0000 mg | ORAL_CAPSULE | Freq: Once | ORAL | Status: AC
  Administered 2015-05-27: 300 mg via ORAL
  Filled 2015-05-27: qty 2

## 2015-05-27 MED ORDER — HYDROMORPHONE HCL 1 MG/ML IJ SOLN
1.0000 mg | Freq: Once | INTRAMUSCULAR | Status: AC
  Administered 2015-05-27: 1 mg via INTRAMUSCULAR
  Filled 2015-05-27: qty 1

## 2015-05-27 MED ORDER — OXYCODONE HCL 5 MG PO TABS: 5.0000 mg | ORAL_TABLET | ORAL | Status: DC | PRN

## 2015-05-27 MED ORDER — CLINDAMYCIN HCL 300 MG PO CAPS: 300.0000 mg | ORAL_CAPSULE | Freq: Four times a day (QID) | ORAL | Status: DC

## 2015-05-27 MED ORDER — TETANUS-DIPHTH-ACELL PERTUSSIS 5-2.5-18.5 LF-MCG/0.5 IM SUSP
0.5000 mL | Freq: Once | INTRAMUSCULAR | Status: AC
  Administered 2015-05-27: 0.5 mL via INTRAMUSCULAR
  Filled 2015-05-27: qty 0.5

## 2015-05-27 MED ORDER — VANCOMYCIN HCL IN DEXTROSE 1-5 GM/200ML-% IV SOLN: 1000.0000 mg | Freq: Once | INTRAVENOUS | Status: DC

## 2015-05-27 NOTE — ED Provider Notes (Signed)
CSN: [id number]     Arrival date & time 05/27/15  1201 History   First MD Initiated Contact with Patient 05/27/15 1529     Chief Complaint  Patient presents with  . Foot Pain   Images contained in this chart were taken by me using Haiku application on my iPhone after verbal consent from the patient. No images were stored in any way on my personal device.   (Consider location/radiation/quality/duration/timing/severity/associated sxs/prior Treatment) Patient is a 45 y.o. female presenting with lower extremity pain.  Foot Pain This is a new problem. The current episode started more than 1 week ago. The problem occurs constantly. The problem has been gradually worsening. Pertinent negatives include no chest pain, no abdominal pain, no headaches and no shortness of breath. Nothing aggravates the symptoms. Nothing relieves the symptoms. She has tried a cold compress, a warm compress, water and rest for the symptoms. The treatment provided no relief.    Past Medical History  Diagnosis Date  . Fibromyalgia   . Addison disease (HCC)   . Hepatitis C    Past Surgical History  Procedure Laterality Date  . Orthopedic surgery      knee surgery  . Cholecystectomy    . Esophagogastroduodenoscopy  11/23/2011    Procedure: ESOPHAGOGASTRODUODENOSCOPY (EGD);  Surgeon: Malissa Hippo, MD;  Location: [facility];  Service: Endoscopy;  Laterality: N/A;  3:25   No family history on file. Social History  Substance Use Topics  . Smoking status: Current Every Day Smoker -- 1.00 packs/day    Types: Cigarettes  . Smokeless tobacco: None     Comment: 1 pack a day.  . Alcohol Use: No   OB History    No data available     Review of Systems  Constitutional: Negative for fever.  HENT: Negative for congestion and facial swelling.   Eyes: Negative for discharge and redness.  Respiratory: Negative for cough and shortness of breath.   Cardiovascular: Negative for chest pain.  Gastrointestinal: Negative  for nausea, vomiting, abdominal pain and abdominal distention.  Endocrine: Negative for polydipsia.  Genitourinary: Negative for dysuria.  Musculoskeletal: Negative for back pain and gait problem.  Skin: Positive for rash (and swelling to her left foot, dorsum and plantar aspects causing significant discomfort). Negative for wound.  Neurological: Negative for headaches.      Allergies  Asa and Tylenol  Home Medications   Prior to Admission medications   Medication Sig Start Date End Date Taking? Authorizing Provider  busPIRone (BUSPAR) 10 MG tablet Take 10 mg by mouth 3 (three) times daily. 03/03/15   Historical Provider, MD  clindamycin (CLEOCIN) 300 MG capsule Take 1 capsule (300 mg total) by mouth 4 (four) times daily. 05/27/15 06/03/15  Marily Memos, MD  clonazePAM (KLONOPIN) 0.5 MG tablet Take 0.5 mg by mouth 2 (two) times daily. 05/22/15   Historical Provider, MD  oxyCODONE (ROXICODONE) 5 MG immediate release tablet Take 1-2 tablets (5-10 mg total) by mouth every 4 (four) hours as needed for severe pain. 05/27/15   Marily Memos, MD   BP 124/57 mmHg  Pulse 102  Temp(Src) 98.4 F (36.9 C) (Oral)  Resp 16  Ht  (1.626 m)  Wt 145 lb (65.772 kg)  BMI 24.88 kg/m2  SpO2 100%  LMP 11/16/2011 Physical Exam  Constitutional: She is oriented to person, place, and time. She appears well-developed and well-nourished.  HENT:  Head: Normocephalic and atraumatic.  Neck: Normal range of motion.  Cardiovascular: Normal rate and  regular rhythm.   Pulmonary/Chest: Effort normal. No stridor. No respiratory distress.  Abdominal: Soft. She exhibits no distension. There is no rebound.  Musculoskeletal: Normal range of motion. She exhibits no edema or tenderness.  Neurological: She is alert and oriented to person, place, and time. No cranial nerve deficit. Coordination normal.  Skin: Skin is warm and dry. There is erythema.  See picture below for description of wounds  Nursing note and vitals  reviewed.        ED Course  Procedures (including critical care time) Labs Review Labs Reviewed  CBC WITH DIFFERENTIAL/PLATELET - Abnormal; Notable for the following:    WBC 17.3 (*)    RBC 5.19 (*)    Hemoglobin 16.5 (*)    HCT 46.4 (*)    Neutro Abs 13.3 (*)    All other components within normal limits  BASIC METABOLIC PANEL - Abnormal; Notable for the following:    Glucose, Bld 111 (*)    All other components within normal limits    Imaging Review Dg Foot Complete Left  05/27/2015  CLINICAL DATA:  45 year old female who tripped over a copper wire 1 week ago with continued pain redness and swelling about the second toe. Initial encounter. EXAM: LEFT FOOT - COMPLETE 3+ VIEW COMPARISON:  None. FINDINGS: Bone mineralization is within normal limits. Calcaneus intact with mild degenerative spurring. Tarsal bones appear intact. Metatarsals intact. Phalanges intact. Joint spaces and alignment within normal limits. No radiopaque foreign body identified. IMPRESSION: Negative radiographic appearance of the left foot. Electronically Signed   By: Odessa Fleming M.D.   On: 05/27/2015 12:53   I have personally reviewed and evaluated these images and lab results as part of my medical decision-making.   EKG Interpretation None      MDM   Final diagnoses:  Cellulitis of left lower extremity    Left foot cellulitis after stepping on a copper wire about a week ago. Intact pulses, intact neuro function. No e/o abscess on exam, no wound to be explored at this time. tdap updated. xr negative for foreign body. Pain control and abx provided. If symptoms not improved in 2-3 days will return for reevaluation otherwise will follow up with PCP.     Marily Memos, MD 05/28/15 1058

## 2015-05-27 NOTE — ED Notes (Signed)
Pt c/o left foot pain/redness/swelling x 1 week. Pt states she stepped on copper wire x 1 week ago.

## 2015-05-29 ENCOUNTER — Emergency Department (HOSPITAL_COMMUNITY): Payer: Medicare PPO

## 2015-05-29 ENCOUNTER — Encounter (HOSPITAL_COMMUNITY): Payer: Self-pay | Admitting: Emergency Medicine

## 2015-05-29 ENCOUNTER — Inpatient Hospital Stay (HOSPITAL_COMMUNITY)
Admission: EM | Admit: 2015-05-29 | Discharge: 2015-06-01 | DRG: 854 | Disposition: A | Discharge status: Home Health | Payer: Medicare PPO | Attending: Internal Medicine | Admitting: Internal Medicine

## 2015-05-29 DIAGNOSIS — F419 Anxiety disorder, unspecified: Secondary | ICD-10-CM | POA: Diagnosis present

## 2015-05-29 DIAGNOSIS — L0390 Cellulitis, unspecified: Secondary | ICD-10-CM | POA: Insufficient documentation

## 2015-05-29 DIAGNOSIS — R6520 Severe sepsis without septic shock: Secondary | ICD-10-CM | POA: Diagnosis present

## 2015-05-29 DIAGNOSIS — M797 Fibromyalgia: Secondary | ICD-10-CM | POA: Diagnosis present

## 2015-05-29 DIAGNOSIS — J449 Chronic obstructive pulmonary disease, unspecified: Secondary | ICD-10-CM | POA: Diagnosis present

## 2015-05-29 DIAGNOSIS — K219 Gastro-esophageal reflux disease without esophagitis: Secondary | ICD-10-CM | POA: Diagnosis present

## 2015-05-29 DIAGNOSIS — L02619 Cutaneous abscess of unspecified foot: Secondary | ICD-10-CM | POA: Diagnosis present

## 2015-05-29 DIAGNOSIS — E271 Primary adrenocortical insufficiency: Secondary | ICD-10-CM | POA: Diagnosis present

## 2015-05-29 DIAGNOSIS — L03116 Cellulitis of left lower limb: Secondary | ICD-10-CM | POA: Insufficient documentation

## 2015-05-29 DIAGNOSIS — G629 Polyneuropathy, unspecified: Secondary | ICD-10-CM | POA: Diagnosis present

## 2015-05-29 DIAGNOSIS — N179 Acute kidney failure, unspecified: Secondary | ICD-10-CM | POA: Diagnosis present

## 2015-05-29 DIAGNOSIS — F17210 Nicotine dependence, cigarettes, uncomplicated: Secondary | ICD-10-CM | POA: Diagnosis present

## 2015-05-29 DIAGNOSIS — A419 Sepsis, unspecified organism: Secondary | ICD-10-CM | POA: Diagnosis present

## 2015-05-29 DIAGNOSIS — L03119 Cellulitis of unspecified part of limb: Secondary | ICD-10-CM

## 2015-05-29 DIAGNOSIS — L02612 Cutaneous abscess of left foot: Secondary | ICD-10-CM | POA: Insufficient documentation

## 2015-05-29 DIAGNOSIS — Z720 Tobacco use: Secondary | ICD-10-CM | POA: Diagnosis present

## 2015-05-29 DIAGNOSIS — L0291 Cutaneous abscess, unspecified: Secondary | ICD-10-CM | POA: Diagnosis present

## 2015-05-29 DIAGNOSIS — B1920 Unspecified viral hepatitis C without hepatic coma: Secondary | ICD-10-CM | POA: Diagnosis present

## 2015-05-29 HISTORY — DX: Chronic obstructive pulmonary disease, unspecified: J44.9

## 2015-05-29 LAB — BASIC METABOLIC PANEL
Anion gap: 9 (ref 5–15)
BUN: 16 mg/dL (ref 6–20)
CALCIUM: 8.8 mg/dL — AB (ref 8.9–10.3)
CHLORIDE: 100 mmol/L — AB (ref 101–111)
CO2: 27 mmol/L (ref 22–32)
CREATININE: 1.11 mg/dL — AB (ref 0.44–1.00)
GFR calc non Af Amer: 60 mL/min — ABNORMAL LOW (ref >60)
Glucose, Bld: 107 mg/dL — ABNORMAL HIGH (ref 65–99)
Potassium: 3.5 mmol/L (ref 3.5–5.1)
SODIUM: 136 mmol/L (ref 135–145)

## 2015-05-29 LAB — CBC WITH DIFFERENTIAL/PLATELET
BASOS ABS: 0.1 10*3/uL (ref 0.0–0.1)
BASOS PCT: 1 %
EOS ABS: 0.3 10*3/uL (ref 0.0–0.7)
Eosinophils Relative: 2 %
HCT: 44.4 % (ref 36.0–46.0)
HEMOGLOBIN: 15.7 g/dL — AB (ref 12.0–15.0)
Lymphocytes Relative: 26 %
Lymphs Abs: 3.9 10*3/uL (ref 0.7–4.0)
MCH: 31.6 pg (ref 26.0–34.0)
MCHC: 35.4 g/dL (ref 30.0–36.0)
MCV: 89.3 fL (ref 78.0–100.0)
MONOS PCT: 5 %
Monocytes Absolute: 0.7 10*3/uL (ref 0.1–1.0)
NEUTROS PCT: 66 %
Neutro Abs: 9.9 10*3/uL — ABNORMAL HIGH (ref 1.7–7.7)
Platelets: 321 10*3/uL (ref 150–400)
RBC: 4.97 MIL/uL (ref 3.87–5.11)
RDW: 12.5 % (ref 11.5–15.5)
WBC: 14.8 10*3/uL — AB (ref 4.0–10.5)

## 2015-05-29 LAB — I-STAT CG4 LACTIC ACID, ED: Lactic Acid, Venous: 1.75 mmol/L (ref 0.5–2.0)

## 2015-05-29 LAB — I-STAT BETA HCG BLOOD, ED (MC, WL, AP ONLY)

## 2015-05-29 MED ORDER — IOHEXOL 300 MG/ML  SOLN
100.0000 mL | Freq: Once | INTRAMUSCULAR | Status: AC | PRN
  Administered 2015-05-29: 100 mL via INTRAVENOUS

## 2015-05-29 MED ORDER — FENTANYL CITRATE (PF) 100 MCG/2ML IJ SOLN
50.0000 ug | INTRAMUSCULAR | Status: AC | PRN
  Administered 2015-05-29 – 2015-05-30 (×2): 50 ug via INTRAVENOUS
  Filled 2015-05-29 (×2): qty 2

## 2015-05-29 NOTE — ED Notes (Signed)
Patient states she was seen here two days ago for cellulitis of left foot and given clindamycin. States redness and swelling has worsened.

## 2015-05-30 ENCOUNTER — Encounter (HOSPITAL_COMMUNITY): Payer: Self-pay | Admitting: Family Medicine

## 2015-05-30 DIAGNOSIS — B1920 Unspecified viral hepatitis C without hepatic coma: Secondary | ICD-10-CM | POA: Diagnosis present

## 2015-05-30 DIAGNOSIS — L02612 Cutaneous abscess of left foot: Secondary | ICD-10-CM

## 2015-05-30 DIAGNOSIS — A419 Sepsis, unspecified organism: Secondary | ICD-10-CM | POA: Diagnosis present

## 2015-05-30 DIAGNOSIS — L03116 Cellulitis of left lower limb: Secondary | ICD-10-CM | POA: Insufficient documentation

## 2015-05-30 DIAGNOSIS — J438 Other emphysema: Secondary | ICD-10-CM | POA: Diagnosis not present

## 2015-05-30 DIAGNOSIS — F17210 Nicotine dependence, cigarettes, uncomplicated: Secondary | ICD-10-CM | POA: Diagnosis present

## 2015-05-30 DIAGNOSIS — Z720 Tobacco use: Secondary | ICD-10-CM | POA: Diagnosis present

## 2015-05-30 DIAGNOSIS — J449 Chronic obstructive pulmonary disease, unspecified: Secondary | ICD-10-CM | POA: Diagnosis present

## 2015-05-30 DIAGNOSIS — F419 Anxiety disorder, unspecified: Secondary | ICD-10-CM | POA: Diagnosis present

## 2015-05-30 DIAGNOSIS — G629 Polyneuropathy, unspecified: Secondary | ICD-10-CM | POA: Diagnosis present

## 2015-05-30 DIAGNOSIS — L03119 Cellulitis of unspecified part of limb: Secondary | ICD-10-CM

## 2015-05-30 DIAGNOSIS — M79672 Pain in left foot: Secondary | ICD-10-CM | POA: Diagnosis present

## 2015-05-30 DIAGNOSIS — R6520 Severe sepsis without septic shock: Secondary | ICD-10-CM | POA: Diagnosis present

## 2015-05-30 DIAGNOSIS — K219 Gastro-esophageal reflux disease without esophagitis: Secondary | ICD-10-CM | POA: Diagnosis present

## 2015-05-30 DIAGNOSIS — N179 Acute kidney failure, unspecified: Secondary | ICD-10-CM | POA: Diagnosis present

## 2015-05-30 DIAGNOSIS — L02619 Cutaneous abscess of unspecified foot: Secondary | ICD-10-CM | POA: Diagnosis present

## 2015-05-30 DIAGNOSIS — M797 Fibromyalgia: Secondary | ICD-10-CM | POA: Diagnosis present

## 2015-05-30 DIAGNOSIS — L0390 Cellulitis, unspecified: Secondary | ICD-10-CM | POA: Insufficient documentation

## 2015-05-30 LAB — CBC WITH DIFFERENTIAL/PLATELET
BASOS ABS: 0.1 10*3/uL (ref 0.0–0.1)
Basophils Relative: 1 %
EOS ABS: 0.4 10*3/uL (ref 0.0–0.7)
EOS PCT: 3 %
HCT: 39.7 % (ref 36.0–46.0)
Hemoglobin: 13.4 g/dL (ref 12.0–15.0)
LYMPHS PCT: 27 %
Lymphs Abs: 3.7 10*3/uL (ref 0.7–4.0)
MCH: 30.2 pg (ref 26.0–34.0)
MCHC: 33.8 g/dL (ref 30.0–36.0)
MCV: 89.4 fL (ref 78.0–100.0)
Monocytes Absolute: 0.8 10*3/uL (ref 0.1–1.0)
Monocytes Relative: 6 %
Neutro Abs: 9 10*3/uL — ABNORMAL HIGH (ref 1.7–7.7)
Neutrophils Relative %: 65 %
PLATELETS: 296 10*3/uL (ref 150–400)
RBC: 4.44 MIL/uL (ref 3.87–5.11)
RDW: 12.4 % (ref 11.5–15.5)
WBC: 13.9 10*3/uL — AB (ref 4.0–10.5)

## 2015-05-30 LAB — BASIC METABOLIC PANEL
Anion gap: 10 (ref 5–15)
BUN: 13 mg/dL (ref 6–20)
CHLORIDE: 104 mmol/L (ref 101–111)
CO2: 28 mmol/L (ref 22–32)
CREATININE: 1 mg/dL (ref 0.44–1.00)
Calcium: 8.4 mg/dL — ABNORMAL LOW (ref 8.9–10.3)
GFR calc non Af Amer: >60 mL/min (ref >60)
Glucose, Bld: 102 mg/dL — ABNORMAL HIGH (ref 65–99)
POTASSIUM: 3.9 mmol/L (ref 3.5–5.1)
SODIUM: 142 mmol/L (ref 135–145)

## 2015-05-30 LAB — PROTIME-INR
INR: 1.01 (ref 0.00–1.49)
PROTHROMBIN TIME: 13.5 s (ref 11.6–15.2)

## 2015-05-30 LAB — MAGNESIUM: MAGNESIUM: 2.1 mg/dL (ref 1.7–2.4)

## 2015-05-30 LAB — APTT: APTT: 29 s (ref 24–37)

## 2015-05-30 LAB — PROCALCITONIN: Procalcitonin: <0.1 ng/mL

## 2015-05-30 LAB — LACTIC ACID, PLASMA
LACTIC ACID, VENOUS: 1.3 mmol/L (ref 0.5–2.0)
Lactic Acid, Venous: 1 mmol/L (ref 0.5–2.0)

## 2015-05-30 MED ORDER — VANCOMYCIN HCL IN DEXTROSE 1-5 GM/200ML-% IV SOLN
1000.0000 mg | Freq: Two times a day (BID) | INTRAVENOUS | Status: DC
  Administered 2015-05-30 – 2015-06-01 (×5): 1000 mg via INTRAVENOUS
  Filled 2015-05-30 (×7): qty 200

## 2015-05-30 MED ORDER — NICOTINE 14 MG/24HR TD PT24
14.0000 mg | MEDICATED_PATCH | Freq: Every day | TRANSDERMAL | Status: DC
  Filled 2015-05-30: qty 1

## 2015-05-30 MED ORDER — PIPERACILLIN-TAZOBACTAM 3.375 G IVPB 30 MIN
3.3750 g | Freq: Once | INTRAVENOUS | Status: AC
  Administered 2015-05-30: 3.375 g via INTRAVENOUS
  Filled 2015-05-30 (×2): qty 50

## 2015-05-30 MED ORDER — PANTOPRAZOLE SODIUM 40 MG PO TBEC
40.0000 mg | DELAYED_RELEASE_TABLET | Freq: Every day | ORAL | Status: DC
  Administered 2015-05-30 – 2015-06-01 (×2): 40 mg via ORAL
  Filled 2015-05-30 (×3): qty 1

## 2015-05-30 MED ORDER — BISACODYL 5 MG PO TBEC: 5.0000 mg | DELAYED_RELEASE_TABLET | Freq: Every day | ORAL | Status: DC | PRN

## 2015-05-30 MED ORDER — IBUPROFEN 400 MG PO TABS
400.0000 mg | ORAL_TABLET | Freq: Three times a day (TID) | ORAL | Status: AC
  Administered 2015-05-30 – 2015-05-31 (×5): 400 mg via ORAL
  Filled 2015-05-30 (×4): qty 1

## 2015-05-30 MED ORDER — BUSPIRONE HCL 5 MG PO TABS
10.0000 mg | ORAL_TABLET | Freq: Three times a day (TID) | ORAL | Status: DC
  Administered 2015-05-30 – 2015-06-01 (×6): 10 mg via ORAL
  Filled 2015-05-30 (×2): qty 1
  Filled 2015-05-30 (×2): qty 2
  Filled 2015-05-30: qty 1
  Filled 2015-05-30 (×3): qty 2
  Filled 2015-05-30 (×2): qty 1
  Filled 2015-05-30: qty 2
  Filled 2015-05-30 (×2): qty 1

## 2015-05-30 MED ORDER — HEPARIN SODIUM (PORCINE) 5000 UNIT/ML IJ SOLN
5000.0000 [IU] | Freq: Three times a day (TID) | INTRAMUSCULAR | Status: DC
  Administered 2015-05-30 (×2): 5000 [IU] via SUBCUTANEOUS
  Filled 2015-05-30 (×2): qty 1

## 2015-05-30 MED ORDER — ONDANSETRON HCL 4 MG/2ML IJ SOLN
4.0000 mg | Freq: Four times a day (QID) | INTRAMUSCULAR | Status: DC | PRN
  Filled 2015-05-30: qty 2

## 2015-05-30 MED ORDER — POTASSIUM CHLORIDE IN NACL 20-0.9 MEQ/L-% IV SOLN
INTRAVENOUS | Status: AC
  Administered 2015-05-30 (×2): via INTRAVENOUS

## 2015-05-30 MED ORDER — IPRATROPIUM-ALBUTEROL 0.5-2.5 (3) MG/3ML IN SOLN
3.0000 mL | RESPIRATORY_TRACT | Status: DC | PRN
Start: 2015-05-30 — End: 2015-06-01

## 2015-05-30 MED ORDER — OXYCODONE HCL 5 MG PO TABS
5.0000 mg | ORAL_TABLET | ORAL | Status: DC | PRN
  Administered 2015-05-30 (×4): 10 mg via ORAL
  Filled 2015-05-30: qty 2
  Filled 2015-05-30: qty 1
  Filled 2015-05-30: qty 2
  Filled 2015-05-30: qty 1
  Filled 2015-05-30 (×2): qty 2

## 2015-05-30 MED ORDER — CLONAZEPAM 0.5 MG PO TABS
0.5000 mg | ORAL_TABLET | Freq: Two times a day (BID) | ORAL | Status: DC
  Administered 2015-05-30 – 2015-06-01 (×6): 0.5 mg via ORAL
  Filled 2015-05-30 (×6): qty 1

## 2015-05-30 MED ORDER — PIPERACILLIN-TAZOBACTAM 3.375 G IVPB
3.3750 g | Freq: Three times a day (TID) | INTRAVENOUS | Status: DC
  Administered 2015-05-30 – 2015-06-01 (×5): 3.375 g via INTRAVENOUS
  Filled 2015-05-30 (×10): qty 50

## 2015-05-30 MED ORDER — VANCOMYCIN HCL IN DEXTROSE 750-5 MG/150ML-% IV SOLN
INTRAVENOUS | Status: AC
  Filled 2015-05-30: qty 150

## 2015-05-30 MED ORDER — HYDROMORPHONE HCL 1 MG/ML IJ SOLN
1.0000 mg | INTRAMUSCULAR | Status: DC | PRN
  Administered 2015-05-30 – 2015-06-01 (×23): 1 mg via INTRAVENOUS
  Filled 2015-05-30 (×24): qty 1

## 2015-05-30 MED ORDER — MAGNESIUM SULFATE (LAXATIVE) PO GRAN
2.0000 g | GRANULES | Freq: Three times a day (TID) | ORAL | Status: DC
  Administered 2015-05-30 – 2015-05-31 (×2): 2 g
  Filled 2015-05-30: qty 454

## 2015-05-30 MED ORDER — VANCOMYCIN HCL IN DEXTROSE 750-5 MG/150ML-% IV SOLN
750.0000 mg | Freq: Once | INTRAVENOUS | Status: AC
  Administered 2015-05-30: 750 mg via INTRAVENOUS
  Filled 2015-05-30: qty 150

## 2015-05-30 MED ORDER — ONDANSETRON HCL 4 MG PO TABS: 4.0000 mg | ORAL_TABLET | Freq: Four times a day (QID) | ORAL | Status: DC | PRN

## 2015-05-30 MED ORDER — HYDROMORPHONE HCL 1 MG/ML IJ SOLN
1.0000 mg | INTRAMUSCULAR | Status: DC | PRN
  Administered 2015-05-30: 1 mg via INTRAVENOUS
  Filled 2015-05-30: qty 1

## 2015-05-30 MED ORDER — POLYETHYLENE GLYCOL 3350 17 G PO PACK: 17.0000 g | PACK | Freq: Every day | ORAL | Status: DC | PRN

## 2015-05-30 NOTE — Progress Notes (Signed)
Patient and family requested that she be allowed to "go outside for fresh air". Discussed that it's not recommended that she leave the floor for safety due to wound on her foot, IV fluids and receiving pain medication. Patient requested nursing ask MD. Notified Dr. Gwenlyn Perking of request. MD recommendation that she not leave the unit. Discussed with patient. Later stated "i need to smoke." Offered nicotine patch again as ordered, pt stated "no, they don't help me." verbalized understanding of not leaving the unit for safety. Earnstine Regal, RN

## 2015-05-30 NOTE — Progress Notes (Signed)
Patient seen and examined. Admitted after midnight secondary to left foot cellulitis with concerns for abscess formation. Patient currently afebrile. Mild elevation of her wbc's and complaining of significant pain on her left foot. On exam there is an area of swelling with what appears to be purulent discharge inside, erythema and tenderness to palpation. Please refer to H&P written by Dr. Antionette Char for further info/details on admission.  Plan: -Orthopedic surgery has finished evaluating patient and recommend soaking patient's foot 3 times a day on Epson salt and continue IV broad spectrum antibiotics -will reevaluate in 24 hours -will add ibuprofen, to help with pain and inflammation  -follow clinical response  -Smoking cessation counseling has been provided a nicotine patch has been ordered.  Kathleen Mcpherson [phone]

## 2015-05-30 NOTE — Progress Notes (Signed)
ANTIBIOTIC CONSULT NOTE-Preliminary  Pharmacy Consult for Vancomycin Indication: Cellulitis  Allergies  Allergen Reactions  . Asa [Aspirin] Other (See Comments)    G.I.Upset  . Tylenol [Acetaminophen] Other (See Comments)    G.I. Upset    Patient Measurements: Height:  (162.6 cm) Weight: 145 lb (65.772 kg) IBW/kg (Calculated) : 54.7  Vital Signs: Temp: 98.2 F (36.8 C) (02/24 2059) Temp Source: Oral (02/24 2059) BP: 118/83 mmHg (02/24 2335) Pulse Rate: 84 (02/24 2335)  Labs:  Recent Labs  05/27/15 1308 05/29/15 2222  WBC 17.3* 14.8*  HGB 16.5* 15.7*  PLT 306 321  CREATININE 0.70 1.11*    Estimated Creatinine Clearance: 61 mL/min (by C-G formula based on Cr of 1.11).  No results for input(s): VANCOTROUGH, VANCOPEAK, VANCORANDOM, GENTTROUGH, GENTPEAK, GENTRANDOM, TOBRATROUGH, TOBRAPEAK, TOBRARND, AMIKACINPEAK, AMIKACINTROU, AMIKACIN in the last 72 hours.   Microbiology: No results found for this or any previous visit (from the past 720 hour(s)).  Medical History: Past Medical History  Diagnosis Date  . Fibromyalgia   . Addison disease (HCC)   . Hepatitis C   . COPD (chronic obstructive pulmonary disease) (HCC)     Medications:   Assessment: 45 yo female with hx of Addison's disease and Hepatitis C seen in the ED for worsening cellulitis of her left lower extremity. Pt was seen in the ED on 05/27/15 for current problem and discharged on PO clindamycin; however pt reports increased redness and swelling. Vancomycin to be started empirically.  Goal of Therapy:  Vancomycin troughs 10-15 mcg/ml  Plan:  Preliminary review of pertinent patient information completed.  Protocol will be initiated with a one-time dose of Vancomycin 750 mg IV.Marland Kitchen  Jeani Hawking clinical pharmacist will complete review during morning rounds to assess patient and finalize treatment regimen.  Arelia Sneddon, [facility] 05/30/2015,12:24 AM

## 2015-05-30 NOTE — Progress Notes (Signed)
ANTIBIOTIC CONSULT NOTE  Pharmacy Consult for Vancomycin Indication: Cellulitis  Plan:  Zosyn 3.375gm IV q8h, EID  Vancomycin  IV q12hrs  Check trough at steady state  Monitor labs, progress, renal fxn, c/s   Allergies  Allergen Reactions  . Asa [Aspirin] Other (See Comments)    G.I.Upset  . Prednisone Itching  . Tylenol [Acetaminophen] Other (See Comments)    G.I. Upset   Patient Measurements: Height:  (162.6 cm) Weight: 145 lb (65.772 kg) IBW/kg (Calculated) : 54.7  Vital Signs: Temp: 98.4 F (36.9 C) (02/25 0607) Temp Source: Oral (02/25 0607) BP: 104/77 mmHg (02/25 0607) Pulse Rate: 91 (02/25 0607)  Labs:  Recent Labs  05/27/15 1308 05/29/15 2222 05/30/15 0414  WBC 17.3* 14.8* 13.9*  HGB 16.5* 15.7* 13.4  PLT 306 321 296  CREATININE 0.70 1.11* 1.00   No results for input(s): VANCOTROUGH, VANCOPEAK, VANCORANDOM, GENTTROUGH, GENTPEAK, GENTRANDOM, TOBRATROUGH, TOBRAPEAK, TOBRARND, AMIKACINPEAK, AMIKACINTROU, AMIKACIN in the last 72 hours.   Microbiology: No results found for this or any previous visit (from the past 720 hour(s)).  Medical History: Past Medical History  Diagnosis Date  . Fibromyalgia   . Addison disease (HCC)   . Hepatitis C   . COPD (chronic obstructive pulmonary disease) (HCC)    Assessment: 45 yo female with hx of Addison's disease and Hepatitis C seen in the ED for worsening cellulitis of her left lower extremity. Pt was seen in the ED on 05/27/15 for current problem and discharged on PO clindamycin; however pt reports increased redness and swelling. Vancomycin to be started empirically.  Goal of Therapy:  Vancomycin troughs 10-15 mcg/ml  Thank you for allowing pharmacy services to participate in this pt's care.  Valrie Hart A, RPH 05/30/2015,11:00 AM

## 2015-05-30 NOTE — Progress Notes (Signed)
Warm soak with epsom salt for left foot as ordered per Dr. Romeo Apple. 5 min after patient placed foot in epsom salt soak, she called stating "my foot ruptured". Blister had opened, moderate amount of purulent drainage and blood noted. Cleaned area. Notified Dr. Gwenlyn Perking. Stated okay for wet-to-dry dressing. No other orders at this time. Dressing applied. Small amount of yellow drainage noted to portion of blister that has not opened. Earnstine Regal, RN

## 2015-05-30 NOTE — Consult Note (Addendum)
Reason for Consult:foot infection Referring Physician: Dyann Kief, md  Kathleen Mcpherson is an 45 y.o. female.   The patient confirmed the following history:   Chief Complaint:  Left foot pain, swelling    HPI: Kathleen Mcpherson is a 45 y.o. female with PMH of tobacco abuse, COPD, and fibromyalgia presents to the ED with left foot pain, swelling, and erythema worsening over the past week and a half after stepping on copper wires which pierced skin. Patient reports approximately 1-1/2 weeks ago, her husband was working with copper wires and will walking barefoot, she stepped on some pieces of the wire, suffering multiple tiny puncture wounds at the plantar surface of her left MTPs. There was immediate pain, but minimal swelling or erythema in until 05/26/2015 when the foot became acutely erythematous and swollen with marked increase in pain. She sought evaluation at this ED on 05/27/2015, was given tetanus immunization, and discharged home with clindamycin. Unfortunately, despite treatment with clindamycin, pain and swelling have continued to worsen. Patient denies subjective fevers or chills, but notes development of general malaise over the past 2 days. Patient also notes recent worsening in her chronic dyspnea and wheezing. She attributes this to not using her home nebulizer for the past couple days that she had been preoccupied with the foot pain.  In ED, patient was found to be afebrile, saturating well on room air, with heart rate in the low 100s, and vitals otherwise stable. Basic blood work was sent off and returns notable for a leukocytosis to 15,000 and serum creatinine of 1.11, up from an apparent baseline of 0.7. The left foot was noted to be red, hot, and swollen, with a large tense pus filled bulla between the first and second digits. Patient was sent for CT of the left foot which demonstrates an evolving deep abscess of the left forefoot. Dr. Aline Brochure of orthopedic surgery was consulted from the  emergency department and has indicated that he will see the patient in the morning. Vancomycin was administered empirically in the emergency department and the patient will be admitted for ongoing evaluation and management of severe sepsis secondary to skin/soft tissue infection of the left foot.   Past Medical History  Diagnosis Date  . Fibromyalgia   . Addison disease ([place])   . Hepatitis C   . COPD (chronic obstructive pulmonary disease) [facility])     Past Surgical History  Procedure Laterality Date  . Orthopedic surgery      knee surgery  . Cholecystectomy    . Esophagogastroduodenoscopy  11/23/2011    Procedure: ESOPHAGOGASTRODUODENOSCOPY (EGD);  Surgeon: Rogene Houston, MD;  Location: [facility];  Service: Endoscopy;  Laterality: N/A;  3:25    History reviewed. No pertinent family history.  Social History:  reports that she has been smoking Cigarettes.  She has been smoking about 1.00 pack per day. She does not have any smokeless tobacco history on file. She reports that she does not drink alcohol or use illicit drugs.  Allergies:  Allergies  Allergen Reactions  . Asa [Aspirin] Other (See Comments)    G.I.Upset  . Prednisone Itching  . Tylenol [Acetaminophen] Other (See Comments)    G.I. Upset    Medications: I have reviewed the patient's current medications.  Results for orders placed or performed during the hospital encounter of 05/29/15 (from the past 48 hour(s))  CBC with Differential     Status: Abnormal   Collection Time: 05/29/15 10:22 PM  Result Value Ref Range  WBC 14.8 (H) 4.0 - 10.5 K/uL   RBC 4.97 3.87 - 5.11 MIL/uL   Hemoglobin 15.7 (H) 12.0 - 15.0 g/dL   HCT 44.4 36.0 - 46.0 %   MCV 89.3 78.0 - 100.0 fL   MCH 31.6 26.0 - 34.0 pg   MCHC 35.4 30.0 - 36.0 g/dL   RDW 12.5 11.5 - 15.5 %   Platelets 321 150 - 400 K/uL   Neutrophils Relative % 66 %   Neutro Abs 9.9 (H) 1.7 - 7.7 K/uL   Lymphocytes Relative 26 %   Lymphs Abs 3.9 0.7 - 4.0 K/uL    Monocytes Relative 5 %   Monocytes Absolute 0.7 0.1 - 1.0 K/uL   Eosinophils Relative 2 %   Eosinophils Absolute 0.3 0.0 - 0.7 K/uL   Basophils Relative 1 %   Basophils Absolute 0.1 0.0 - 0.1 K/uL  Basic metabolic panel     Status: Abnormal   Collection Time: 05/29/15 10:22 PM  Result Value Ref Range   Sodium 136 135 - 145 mmol/L   Potassium 3.5 3.5 - 5.1 mmol/L   Chloride 100 (L) 101 - 111 mmol/L   CO2 27 22 - 32 mmol/L   Glucose, Bld 107 (H) 65 - 99 mg/dL   BUN 16 6 - 20 mg/dL   Creatinine, Ser 1.11 (H) 0.44 - 1.00 mg/dL   Calcium 8.8 (L) 8.9 - 10.3 mg/dL   GFR calc non Af Amer 60 (L) >60 mL/min   GFR calc Af Amer >60 >60 mL/min    Comment: (NOTE) The eGFR has been calculated using the CKD EPI equation. This calculation has not been validated in all clinical situations. eGFR's persistently <60 mL/min signify possible Chronic Kidney Disease.    Anion gap 9 5 - 15  I-Stat beta hCG blood, ED     Status: None   Collection Time: 05/29/15 10:29 PM  Result Value Ref Range   I-stat hCG, quantitative <5.0 <5 mIU/mL   Comment 3            Comment:   GEST. AGE      CONC.  (mIU/mL)   <=1 WEEK        5 - 50     2 WEEKS       50 - 500     3 WEEKS       100 - 10,000     4 WEEKS     1,000 - 30,000        FEMALE AND NON-PREGNANT FEMALE:     LESS THAN 5 mIU/mL   I-Stat CG4 Lactic Acid, ED     Status: None   Collection Time: 05/29/15 10:31 PM  Result Value Ref Range   Lactic Acid, Venous 1.75 0.5 - 2.0 mmol/L  Lactic acid, plasma     Status: None   Collection Time: 05/30/15  1:58 AM  Result Value Ref Range   Lactic Acid, Venous 1.3 0.5 - 2.0 mmol/L  Procalcitonin     Status: None   Collection Time: 05/30/15  1:58 AM  Result Value Ref Range   Procalcitonin <0.10 ng/mL    Comment:        Interpretation: PCT (Procalcitonin) <= 0.5 ng/mL: Systemic infection (sepsis) is not likely. Local bacterial infection is possible. (NOTE)         ICU PCT Algorithm               Non ICU PCT  Algorithm    ----------------------------     ------------------------------  PCT < 0.25 ng/mL                 PCT < 0.1 ng/mL     Stopping of antibiotics            Stopping of antibiotics       strongly encouraged.               strongly encouraged.    ----------------------------     ------------------------------       PCT level decrease by               PCT < 0.25 ng/mL       >= 80% from peak PCT       OR PCT 0.25 - 0.5 ng/mL          Stopping of antibiotics                                             encouraged.     Stopping of antibiotics           encouraged.    ----------------------------     ------------------------------       PCT level decrease by              PCT >= 0.25 ng/mL       < 80% from peak PCT        AND PCT >= 0.5 ng/mL            Continuin g antibiotics                                              encouraged.       Continuing antibiotics            encouraged.    ----------------------------     ------------------------------     PCT level increase compared          PCT > 0.5 ng/mL         with peak PCT AND          PCT >= 0.5 ng/mL             Escalation of antibiotics                                          strongly encouraged.      Escalation of antibiotics        strongly encouraged.   Magnesium     Status: None   Collection Time: 05/30/15  1:58 AM  Result Value Ref Range   Magnesium 2.1 1.7 - 2.4 mg/dL  Protime-INR     Status: None   Collection Time: 05/30/15  1:58 AM  Result Value Ref Range   Prothrombin Time 13.5 11.6 - 15.2 seconds   INR 1.01 0.00 - 1.49  APTT     Status: None   Collection Time: 05/30/15  1:58 AM  Result Value Ref Range   aPTT 29 24 - 37 seconds  Lactic acid, plasma     Status: None   Collection Time: 05/30/15  4:14 AM  Result Value Ref Range   Lactic Acid, Venous 1.0 0.5 -  2.0 mmol/L  Basic metabolic panel     Status: Abnormal   Collection Time: 05/30/15  4:14 AM  Result Value Ref Range   Sodium 142 135 - 145  mmol/L   Potassium 3.9 3.5 - 5.1 mmol/L   Chloride 104 101 - 111 mmol/L   CO2 28 22 - 32 mmol/L   Glucose, Bld 102 (H) 65 - 99 mg/dL   BUN 13 6 - 20 mg/dL   Creatinine, Ser 1.00 0.44 - 1.00 mg/dL   Calcium 8.4 (L) 8.9 - 10.3 mg/dL   GFR calc non Af Amer >60 >60 mL/min   GFR calc Af Amer >60 >60 mL/min    Comment: (NOTE) The eGFR has been calculated using the CKD EPI equation. This calculation has not been validated in all clinical situations. eGFR's persistently <60 mL/min signify possible Chronic Kidney Disease.    Anion gap 10 5 - 15  CBC WITH DIFFERENTIAL     Status: Abnormal   Collection Time: 05/30/15  4:14 AM  Result Value Ref Range   WBC 13.9 (H) 4.0 - 10.5 K/uL   RBC 4.44 3.87 - 5.11 MIL/uL   Hemoglobin 13.4 12.0 - 15.0 g/dL   HCT 39.7 36.0 - 46.0 %   MCV 89.4 78.0 - 100.0 fL   MCH 30.2 26.0 - 34.0 pg   MCHC 33.8 30.0 - 36.0 g/dL   RDW 12.4 11.5 - 15.5 %   Platelets 296 150 - 400 K/uL   Neutrophils Relative % 65 %   Neutro Abs 9.0 (H) 1.7 - 7.7 K/uL   Lymphocytes Relative 27 %   Lymphs Abs 3.7 0.7 - 4.0 K/uL   Monocytes Relative 6 %   Monocytes Absolute 0.8 0.1 - 1.0 K/uL   Eosinophils Relative 3 %   Eosinophils Absolute 0.4 0.0 - 0.7 K/uL   Basophils Relative 1 %   Basophils Absolute 0.1 0.0 - 0.1 K/uL    Ct Foot Left W Contrast  05/29/2015  CLINICAL DATA:  Left foot cellulitis for 9 days, after stepping on metal wire. Worsening erythema and swelling. Initial encounter. EXAM: CT OF THE LEFT FOOT WITHOUT CONTRAST TECHNIQUE: Multidetector CT imaging of the left foot was performed according to the standard protocol. Multiplanar CT image reconstructions were also generated. COMPARISON:  Left foot radiographs performed 05/27/2015 FINDINGS: There is a large blister tracking between the first and second toes, measuring approximately 3.8 cm in dorsal-plantar length. This appears to be contiguous with fluid tracking more proximally into the forefoot between the first and  second toes, and plantarly into the thenar eminence. The fluid mostly fills the medial aspect of the thenar eminence, and tracks adjacent to the first metatarsophalangeal joint, and also laterally plantar to the second metatarsophalangeal joint. The fluid along the plantar aspect of the forefoot is only partially organized, with areas of peripheral enhancement, suggesting an evolving abscess. Surrounding soft tissue inflammation is seen. No additional fluid collections are identified. Mild soft tissue inflammation is noted along the plantar aspect of the forefoot more laterally. No definite osseous erosions are seen. Visualized flexor and extensor tendons are grossly unremarkable in appearance. Visualized vasculature is grossly unremarkable in appearance. Visualized joint spaces are preserved. A small amount of fluid is noted tracking about the medial aspect of the talus, likely reflecting a small joint effusion. The sinus tarsi is unremarkable in appearance. The Achilles tendon remains intact. No edema is seen in Kager's fat pad. IMPRESSION: 1. Large blister tracking between the first and second toes,  measuring 3.8 cm in dorsal-plantar length. Per clinical correlation, there is associated purulent drainage. This appears to be contiguous with fluid tracking more proximally into the forefoot between the first and second toes, and plantarly into the thenar eminence. The fluid mostly fills the medial aspect of the thenar eminence, and tracks adjacent to the first metatarsophalangeal joint. It also tracks laterally plantar to the second metatarsophalangeal joint. 2. This fluid is only partially organized, with areas of peripheral enhancement, suggesting an evolving abscess. As it is contiguous with the large blister at this time, it may be drainable by advancing the large posterior and applying careful pressure along the thenar eminence, about the first and second metatarsophalangeal joints. 3. Surrounding soft tissue  inflammation noted, tracking laterally along the plantar aspect of the forefoot. These results were called by telephone at the time of interpretation on 05/29/2015 at 11:46 pm to Dr. Francine Graven, who verbally acknowledged these results. Electronically Signed   By: Garald Balding M.D.   On: 05/29/2015 23:51    ROS Blood pressure 104/77, pulse 91, temperature 98.4 F (36.9 C), temperature source Oral, resp. rate 18, height 5' 4"  (1.626 m), weight 145 lb (65.772 kg), last menstrual period 11/16/2011, SpO2 99 %. Physical Exam  Constitutional: She appears well-developed and well-nourished. She appears lethargic. She appears distressed.  HENT:  Head: Normocephalic and atraumatic.  Eyes: Pupils are equal, round, and reactive to light. Right eye exhibits no discharge. Left eye exhibits no discharge. No scleral icterus.  Neck: Neck supple. No JVD present. No tracheal deviation present. No thyromegaly present.  Cardiovascular: Normal rate and intact distal pulses.   Respiratory: Effort normal. No stridor. She has no wheezes. She exhibits no tenderness.  GI: Soft. Bowel sounds are normal. She exhibits no distension. There is no guarding.  Musculoskeletal:       Right shoulder: She exhibits normal range of motion, no tenderness, no bony tenderness, no swelling, no effusion, no crepitus, no deformity, no laceration, no pain, no spasm, normal pulse and normal strength.       Left shoulder: She exhibits normal range of motion, no tenderness, no bony tenderness, no swelling, no effusion, no crepitus, no deformity, no laceration, no pain, no spasm, normal pulse and normal strength.       Right ankle: She exhibits normal range of motion, no swelling, no ecchymosis, no deformity, no laceration and normal pulse. No tenderness. No lateral malleolus, no medial malleolus, no AITFL, no CF ligament, no posterior TFL, no head of 5th metatarsal and no proximal fibula tenderness found. Achilles tendon normal.        Feet:  Lymphadenopathy:    She has no cervical adenopathy.       Right: No inguinal adenopathy present.       Left: No inguinal adenopathy present.  Neurological: She has normal strength. She appears lethargic. She is not disoriented. She displays no atrophy and no tremor. No cranial nerve deficit or sensory deficit. She exhibits normal muscle tone.  Skin: Skin is warm, dry and intact. No laceration and no lesion noted. Rash is not pustular. There is erythema. No cyanosis. Nails show no clubbing.     Psychiatric: She has a normal mood and affect. Her behavior is normal.    Assessment/Plan: Phlegmon type foot infection left foot with non-inhancing CT with contrast.  Soak tid Re evaluate in 24 hrs  Continue broad spectrum antibiotics   Arther Abbott 05/30/2015, 10:57 AM

## 2015-05-30 NOTE — ED Provider Notes (Signed)
CSN: [id number]     Arrival date & time 05/29/15  2047 History   First MD Initiated Contact with Patient 05/29/15 2206     Chief Complaint  Patient presents with  . Cellulitis      HPI Pt was seen at 2210. Per pt, c/o gradual onset and worsening of persistent left foot "redness and swelling" for the past 9 days. Pt states her symptoms began after she "stepped on a bunch of copper wire pieces in the grass" without shoes on. Pt states her plantar left foot was "stuck with a bunch of them." States she was able to remove them intact. Pt was evaluated in the ED 2 days ago for this complaint, was rx clindamycin, and instructed to f/u in 2 days. Pt returns to the ED today with worsening forefoot erythema and edema, as well as "a blister" between her 1st and 2nd toes. States the "redness going up into my ankle has gotten better." Denies fevers, no focal motor weakness, no tingling/numbness in extremities.     Past Medical History  Diagnosis Date  . Fibromyalgia   . Addison disease (HCC)   . Hepatitis C   . COPD (chronic obstructive pulmonary disease) [facility])    Past Surgical History  Procedure Laterality Date  . Orthopedic surgery      knee surgery  . Cholecystectomy    . Esophagogastroduodenoscopy  11/23/2011    Procedure: ESOPHAGOGASTRODUODENOSCOPY (EGD);  Surgeon: Malissa Hippo, MD;  Location: [facility];  Service: Endoscopy;  Laterality: N/A;  3:25    Social History  Substance Use Topics  . Smoking status: Current Every Day Smoker -- 1.00 packs/day    Types: Cigarettes  . Smokeless tobacco: None     Comment: 1 pack a day.  . Alcohol Use: No    Review of Systems ROS: Statement: All systems negative except as marked or noted in the HPI; Constitutional: Negative for fever and chills. ; ; Eyes: Negative for eye pain, redness and discharge. ; ; ENMT: Negative for ear pain, hoarseness, nasal congestion, sinus pressure and sore throat. ; ; Cardiovascular: Negative for chest pain,  palpitations, diaphoresis, dyspnea and peripheral edema. ; ; Respiratory: Negative for cough, wheezing and stridor. ; ; Gastrointestinal: Negative for nausea, vomiting, diarrhea, abdominal pain, blood in stool, hematemesis, jaundice and rectal bleeding. . ; ; Genitourinary: Negative for dysuria, flank pain and hematuria. ; ; Musculoskeletal: Negative for back pain and neck pain. Negative for swelling and trauma.; ; Skin: +left foot rash, blister. Negative for pruritus, abrasions, and skin lesion.; ; Neuro: Negative for headache, lightheadedness and neck stiffness. Negative for weakness, altered level of consciousness , altered mental status, extremity weakness, paresthesias, involuntary movement, seizure and syncope.      Allergies  Asa and Tylenol  Home Medications   Prior to Admission medications   Medication Sig Start Date End Date Taking? Authorizing Provider  busPIRone (BUSPAR) 10 MG tablet Take 10 mg by mouth 3 (three) times daily. 03/03/15   Historical Provider, MD  clindamycin (CLEOCIN) 300 MG capsule Take 1 capsule (300 mg total) by mouth 4 (four) times daily. 05/27/15 06/03/15  Marily Memos, MD  clonazePAM (KLONOPIN) 0.5 MG tablet Take 0.5 mg by mouth 2 (two) times daily. 05/22/15   Historical Provider, MD  oxyCODONE (ROXICODONE) 5 MG immediate release tablet Take 1-2 tablets (5-10 mg total) by mouth every 4 (four) hours as needed for severe pain. 05/27/15   Marily Memos, MD   BP 118/83 mmHg  Pulse  84  Temp(Src) 98.2 F (36.8 C) (Oral)  Resp 16  Ht 5\' 4"  (1.626 m)  Wt 145 lb (65.772 kg)  BMI 24.88 kg/m2  SpO2 96%  LMP 11/16/2011 Physical Exam  2215: Physical examination:  Nursing notes reviewed; Vital signs and O2 SAT reviewed;  Constitutional: Well developed, Well nourished, Well hydrated, In no acute distress; Head:  Normocephalic, atraumatic; Eyes: EOMI, PERRL, No scleral icterus; ENMT: Mouth and pharynx normal, Mucous membranes moist; Neck: Supple, Full range of motion, No  lymphadenopathy; Cardiovascular: Regular rate and rhythm, No murmur, rub, or gallop; Respiratory: Breath sounds clear & equal bilaterally, No rales, rhonchi, wheezes.  Speaking full sentences with ease, Normal respiratory effort/excursion; Chest: Nontender, Movement normal; Abdomen: Soft, Nontender, Nondistended, Normal bowel sounds; Genitourinary: No CVA tenderness; Extremities: Pulses normal, No calf edema or asymmetry. +left plantar and dorsal forefoot with erythema, edema, induration and warmth. +blister noted between 1st and 2nd toes, tracking to base of plantar 2nd toe. +1st and 2nd toes with erythema. No open wounds left foot.; Neuro: AA&Ox3, Major CN grossly intact.  Speech clear. No gross focal motor or sensory deficits in extremities.; Skin: Color normal, Warm, Dry.   ED Course  Procedures (including critical care time) Labs Review  Imaging Review  I have personally reviewed and evaluated these images and lab results as part of my medical decision-making.   EKG Interpretation None      MDM  MDM Reviewed: previous chart, nursing note and vitals Reviewed previous: labs and x-ray Interpretation: labs and CT scan     Results for orders placed or performed during the hospital encounter of 05/29/15  CBC with Differential  Result Value Ref Range   WBC 14.8 (H) 4.0 - 10.5 K/uL   RBC 4.97 3.87 - 5.11 MIL/uL   Hemoglobin 15.7 (H) 12.0 - 15.0 g/dL   HCT 40.9 81.1 - 91.4 %   MCV 89.3 78.0 - 100.0 fL   MCH 31.6 26.0 - 34.0 pg   MCHC 35.4 30.0 - 36.0 g/dL   RDW 78.2 95.6 - 21.3 %   Platelets 321 150 - 400 K/uL   Neutrophils Relative % 66 %   Neutro Abs 9.9 (H) 1.7 - 7.7 K/uL   Lymphocytes Relative 26 %   Lymphs Abs 3.9 0.7 - 4.0 K/uL   Monocytes Relative 5 %   Monocytes Absolute 0.7 0.1 - 1.0 K/uL   Eosinophils Relative 2 %   Eosinophils Absolute 0.3 0.0 - 0.7 K/uL   Basophils Relative 1 %   Basophils Absolute 0.1 0.0 - 0.1 K/uL  Basic metabolic panel  Result Value Ref  Range   Sodium 136 135 - 145 mmol/L   Potassium 3.5 3.5 - 5.1 mmol/L   Chloride 100 (L) 101 - 111 mmol/L   CO2 27 22 - 32 mmol/L   Glucose, Bld 107 (H) 65 - 99 mg/dL   BUN 16 6 - 20 mg/dL   Creatinine, Ser 0.86 (H) 0.44 - 1.00 mg/dL   Calcium 8.8 (L) 8.9 - 10.3 mg/dL   GFR calc non Af Amer 60 (L) >60 mL/min   GFR calc Af Amer >60 >60 mL/min   Anion gap 9 5 - 15  I-Stat CG4 Lactic Acid, ED  Result Value Ref Range   Lactic Acid, Venous 1.75 0.5 - 2.0 mmol/L  I-Stat beta hCG blood, ED  Result Value Ref Range   I-stat hCG, quantitative <5.0 <5 mIU/mL   Comment 3  Ct Foot Left W Contrast 05/29/2015  CLINICAL DATA:  Left foot cellulitis for 9 days, after stepping on metal wire. Worsening erythema and swelling. Initial encounter. EXAM: CT OF THE LEFT FOOT WITHOUT CONTRAST TECHNIQUE: Multidetector CT imaging of the left foot was performed according to the standard protocol. Multiplanar CT image reconstructions were also generated. COMPARISON:  Left foot radiographs performed 05/27/2015 FINDINGS: There is a large blister tracking between the first and second toes, measuring approximately 3.8 cm in dorsal-plantar length. This appears to be contiguous with fluid tracking more proximally into the forefoot between the first and second toes, and plantarly into the thenar eminence. The fluid mostly fills the medial aspect of the thenar eminence, and tracks adjacent to the first metatarsophalangeal joint, and also laterally plantar to the second metatarsophalangeal joint. The fluid along the plantar aspect of the forefoot is only partially organized, with areas of peripheral enhancement, suggesting an evolving abscess. Surrounding soft tissue inflammation is seen. No additional fluid collections are identified. Mild soft tissue inflammation is noted along the plantar aspect of the forefoot more laterally. No definite osseous erosions are seen. Visualized flexor and extensor tendons are grossly  unremarkable in appearance. Visualized vasculature is grossly unremarkable in appearance. Visualized joint spaces are preserved. A small amount of fluid is noted tracking about the medial aspect of the talus, likely reflecting a small joint effusion. The sinus tarsi is unremarkable in appearance. The Achilles tendon remains intact. No edema is seen in Kager's fat pad. IMPRESSION: 1. Large blister tracking between the first and second toes, measuring 3.8 cm in dorsal-plantar length. Per clinical correlation, there is associated purulent drainage. This appears to be contiguous with fluid tracking more proximally into the forefoot between the first and second toes, and plantarly into the thenar eminence. The fluid mostly fills the medial aspect of the thenar eminence, and tracks adjacent to the first metatarsophalangeal joint. It also tracks laterally plantar to the second metatarsophalangeal joint. 2. This fluid is only partially organized, with areas of peripheral enhancement, suggesting an evolving abscess. As it is contiguous with the large blister at this time, it may be drainable by advancing the large posterior and applying careful pressure along the thenar eminence, about the first and second metatarsophalangeal joints. 3. Surrounding soft tissue inflammation noted, tracking laterally along the plantar aspect of the forefoot. These results were called by telephone at the time of interpretation on 05/29/2015 at 11:46 pm to Dr. Samuel Jester, who verbally acknowledged these results. Electronically Signed   By: Roanna Raider M.D.   On: 05/29/2015 23:51    0010:  CT as above. IV vancomycin ordered. Dx and testing d/w pt.  Questions answered.  Verb understanding, agreeable to admit.  T/C to Orthopedics Dr. Romeo Apple, case discussed, including:  HPI, pertinent PM/SHx, VS/PE, dx testing, ED course and treatment:  Agreeable to consult tomorrow morning, admit to medical service. T/C to [facility] Dr. Antionette Char, case  discussed, including:  HPI, pertinent PM/SHx, VS/PE, dx testing, ED course and treatment:  Agreeable to admit, requests to write temporary orders, obtain medical bed to team [facility].    Samuel Jester, DO 05/31/15 2257

## 2015-05-30 NOTE — H&P (Signed)
[facility] History and Physical  Kathleen Mcpherson ZOX:[id number] DOB: 1970-12-03 DOA: 05/29/2015  Referring physician: ED physician PCP: Evelene Croon, MD  Specialists: None listed  Chief Complaint:  Left foot pain, swelling   HPI: Kathleen Mcpherson is a 45 y.o. female with PMH of tobacco abuse, COPD, and fibromyalgia presents to the ED with left foot pain, swelling, and erythema worsening over the past week and a half after stepping on copper wires which pierced skin. Patient reports approximately 1-1/2 weeks ago, her husband was working with copper wires and will walking barefoot, she stepped on some pieces of the wire, suffering multiple tiny puncture wounds at the plantar surface of her left MTPs. There was immediate pain, but minimal swelling or erythema in until 05/26/2015 when the foot became acutely erythematous and swollen with marked increase in pain. She sought evaluation at this ED on 05/27/2015, was given tetanus immunization, and discharged home with clindamycin. Unfortunately, despite treatment with clindamycin, pain and swelling have continued to worsen. Patient denies subjective fevers or chills, but notes development of general malaise over the past 2 days. Patient also notes recent worsening in her chronic dyspnea and wheezing. She attributes this to not using her home nebulizer for the past couple days that she had been preoccupied with the foot pain.  In ED, patient was found to be afebrile, saturating well on room air, with heart rate in the low 100s, and vitals otherwise stable. Basic blood work was sent off and returns notable for a leukocytosis to 15,000 and serum creatinine of 1.11, up from an apparent baseline of 0.7. The left foot was noted to be red, hot, and swollen, with a large tense pus filled bulla between the first and second digits. Patient was sent for CT of the left foot which demonstrates an evolving deep abscess of the left forefoot. Dr. Romeo Apple of  orthopedic surgery was consulted from the emergency department and has indicated that he will see the patient in the morning. Vancomycin was administered empirically in the emergency department and the patient will be admitted for ongoing evaluation and management of severe sepsis secondary to skin/soft tissue infection of the left foot.  Where does patient live?   At home   Can patient participate in ADLs?  Yes        Review of Systems:   General: no fevers, chills, sweats, weight change, poor appetite, or fatigue HEENT: no blurry vision, hearing changes or sore throat Pulm: no cough.  Dyspnea, wheezing CV: no chest pain or palpitations Abd: no nausea, vomiting, abdominal pain, diarrhea, or constipation GU: no dysuria, hematuria, increased urinary frequency, or urgency  Ext: Left foot edematous, red, hot, tender Neuro: no focal weakness, numbness, or tingling, no vision change or hearing loss Skin: no rash, left foot purulence MSK: No muscle spasm, no deformity, left foot red, hot, swollen, tender Heme: No easy bruising or bleeding Travel history: No recent long distant travel    Allergy:  Allergies  Allergen Reactions  . Asa [Aspirin] Other (See Comments)    G.I.Upset  . Tylenol [Acetaminophen] Other (See Comments)    G.Celestia Khat    Past Medical History  Diagnosis Date  . Fibromyalgia   . Addison disease (HCC)   . Hepatitis C   . COPD (chronic obstructive pulmonary disease) [facility])     Past Surgical History  Procedure Laterality Date  . Orthopedic surgery      knee surgery  . Cholecystectomy    . Esophagogastroduodenoscopy  11/23/2011    Procedure: ESOPHAGOGASTRODUODENOSCOPY (EGD);  Surgeon: Malissa Hippo, MD;  Location: [facility];  Service: Endoscopy;  Laterality: N/A;  3:25    Social History:  reports that she has been smoking Cigarettes.  She has been smoking about 1.00 pack per day. She does not have any smokeless tobacco history on file. She reports that she  does not drink alcohol or use illicit drugs.  Family History: History reviewed. No pertinent family history.   Prior to Admission medications   Medication Sig Start Date End Date Taking? Authorizing Provider  busPIRone (BUSPAR) 10 MG tablet Take 10 mg by mouth 3 (three) times daily. 03/03/15   Historical Provider, MD  clindamycin (CLEOCIN) 300 MG capsule Take 1 capsule (300 mg total) by mouth 4 (four) times daily. 05/27/15 06/03/15  Marily Memos, MD  clonazePAM (KLONOPIN) 0.5 MG tablet Take 0.5 mg by mouth 2 (two) times daily. 05/22/15   Historical Provider, MD  oxyCODONE (ROXICODONE) 5 MG immediate release tablet Take 1-2 tablets (5-10 mg total) by mouth every 4 (four) hours as needed for severe pain. 05/27/15   Marily Memos, MD    Physical Exam: Filed Vitals:   05/29/15 2059 05/29/15 2335  BP: 106/59 118/83  Pulse: 84 84  Temp: 98.2 F (36.8 C)   TempSrc: Oral   Resp: 16 16  Height: 5\' 4"  (1.626 m)   Weight: 65.772 kg (145 lb)   SpO2: 100% 96%   General: Not in acute distress HEENT:       Eyes: PERRL, EOMI, no scleral icterus or conjunctival pallor.       ENT: No discharge from the ears or nose, no pharyngeal ulcers, petechiae or exudate, no tonsillar enlargement.        Neck: No JVD, no bruit, no appreciable mass Heme: No cervical adenopathy, no pallor Cardiac: S1/S2, RRR, No murmurs, No gallops or rubs. Pulm: Good air movement bilaterally. Expiratory wheezes. . Abd: Soft, nondistended, nontender, no rebound pain or gaurding, no mass or organomegaly, BS present. Ext: No right LE edema, 2+DP/PT pulse bilaterally. Left foot with large tense purulent bullae between 1st and 2nd digits, extending from dorsal to plantar aspect with surrounding edema, erythema, heat, and exquisite tenderness  Musculoskeletal: No gross deformity, no red, hot, or swollen joints aside from the left foot findings described above Skin: No rashes or wounds on exposed surfaces aside from left foot findings  described above Neuro: Alert, oriented X3, cranial nerves II-XII grossly intact. No focal findings Psych: Patient is not overtly psychotic, appropriate mood and affect.  Labs on Admission:  Basic Metabolic Panel:  Recent Labs Lab 05/27/15 1308 05/29/15 2222  NA 141 136  K 4.2 3.5  CL 103 100*  CO2 29 27  GLUCOSE 111* 107*  BUN 8 16  CREATININE 0.70 1.11*  CALCIUM 9.4 8.8*   Liver Function Tests: No results for input(s): AST, ALT, ALKPHOS, BILITOT, PROT, ALBUMIN in the last 168 hours. No results for input(s): LIPASE, AMYLASE in the last 168 hours. No results for input(s): AMMONIA in the last 168 hours. CBC:  Recent Labs Lab 05/27/15 1308 05/29/15 2222  WBC 17.3* 14.8*  NEUTROABS 13.3* 9.9*  HGB 16.5* 15.7*  HCT 46.4* 44.4  MCV 89.4 89.3  PLT 306 321   Cardiac Enzymes: No results for input(s): CKTOTAL, CKMB, CKMBINDEX, TROPONINI in the last 168 hours.  BNP (last 3 results) No results for input(s): BNP in the last 8760 hours.  ProBNP (last 3 results) No results  for input(s): PROBNP in the last 8760 hours.  CBG: No results for input(s): GLUCAP in the last 168 hours.  Radiological Exams on Admission: Ct Foot Left W Contrast  05/29/2015  CLINICAL DATA:  Left foot cellulitis for 9 days, after stepping on metal wire. Worsening erythema and swelling. Initial encounter. EXAM: CT OF THE LEFT FOOT WITHOUT CONTRAST TECHNIQUE: Multidetector CT imaging of the left foot was performed according to the standard protocol. Multiplanar CT image reconstructions were also generated. COMPARISON:  Left foot radiographs performed 05/27/2015 FINDINGS: There is a large blister tracking between the first and second toes, measuring approximately 3.8 cm in dorsal-plantar length. This appears to be contiguous with fluid tracking more proximally into the forefoot between the first and second toes, and plantarly into the thenar eminence. The fluid mostly fills the medial aspect of the thenar  eminence, and tracks adjacent to the first metatarsophalangeal joint, and also laterally plantar to the second metatarsophalangeal joint. The fluid along the plantar aspect of the forefoot is only partially organized, with areas of peripheral enhancement, suggesting an evolving abscess. Surrounding soft tissue inflammation is seen. No additional fluid collections are identified. Mild soft tissue inflammation is noted along the plantar aspect of the forefoot more laterally. No definite osseous erosions are seen. Visualized flexor and extensor tendons are grossly unremarkable in appearance. Visualized vasculature is grossly unremarkable in appearance. Visualized joint spaces are preserved. A small amount of fluid is noted tracking about the medial aspect of the talus, likely reflecting a small joint effusion. The sinus tarsi is unremarkable in appearance. The Achilles tendon remains intact. No edema is seen in Kager's fat pad. IMPRESSION: 1. Large blister tracking between the first and second toes, measuring 3.8 cm in dorsal-plantar length. Per clinical correlation, there is associated purulent drainage. This appears to be contiguous with fluid tracking more proximally into the forefoot between the first and second toes, and plantarly into the thenar eminence. The fluid mostly fills the medial aspect of the thenar eminence, and tracks adjacent to the first metatarsophalangeal joint. It also tracks laterally plantar to the second metatarsophalangeal joint. 2. This fluid is only partially organized, with areas of peripheral enhancement, suggesting an evolving abscess. As it is contiguous with the large blister at this time, it may be drainable by advancing the large posterior and applying careful pressure along the thenar eminence, about the first and second metatarsophalangeal joints. 3. Surrounding soft tissue inflammation noted, tracking laterally along the plantar aspect of the forefoot. These results were called by  telephone at the time of interpretation on 05/29/2015 at 11:46 pm to Dr. Samuel Jester, who verbally acknowledged these results. Electronically Signed   By: Roanna Raider M.D.   On: 05/29/2015 23:51    EKG:  Not done in ED, will obtain as appropriate   Assessment/Plan  1. Severe sepsis secondary to skin/soft tissue infection with abscess - Meets criteria on arrival with WBC 14,800, tachycardia, AKI, left foot abscess as source - Had received 2 days of treatment with clindamycin, tetanus immunization 05/27/2015  - CT demonstrates evolving deep abscess felt to be amenable to drainage  - Dr. Romeo Apple of orthopedic surgery was consulted from the ED and plans to see pt in am  - Empiric vancomycin initiated in ED, will continue empiric treatment - Likely requires drainage, will follow-up on ortho recs  - Elevate the foot, pain-control    2. AKI  - SCr 1.11 on arrival tonight, up from 0.70 2 days prior  - Suspect  this represents a mild prerenal azotemia in setting of sepsis and decreased perfusion - Anticipate improvement with IVF hydration  - NS running at 125 cc/hr overnight  - Repeat chem panel tomorrow    3. COPD  - Wheezes on exam  - DuoNeb q4h prn SOB or wheezing  - Tobacco cessation recommended as below   4. Tobacco abuse  - Counseled toward cessation  - RN to provide smoking cessation information prior to discharge  - Nicotine patch prn    DVT ppx: SQ Heparin     Code Status: Full code Family Communication: None at bed side.               Disposition Plan: Admit to inpatient   Date of Service 05/30/2015    Briscoe Deutscher, MD [facility] Pager [phone]  If 7PM-7AM, please contact night-coverage [email or web address] Password [email or web address] 05/30/2015, 1:30 AM

## 2015-05-31 ENCOUNTER — Inpatient Hospital Stay (HOSPITAL_COMMUNITY): Payer: Medicare PPO | Admitting: Anesthesiology

## 2015-05-31 ENCOUNTER — Encounter (HOSPITAL_COMMUNITY)
Admission: EM | Disposition: A | Discharge status: Home Health | Payer: Self-pay | Source: Home / Self Care | Attending: Internal Medicine

## 2015-05-31 ENCOUNTER — Encounter (HOSPITAL_COMMUNITY): Payer: Self-pay | Admitting: Anesthesiology

## 2015-05-31 DIAGNOSIS — K219 Gastro-esophageal reflux disease without esophagitis: Secondary | ICD-10-CM

## 2015-05-31 DIAGNOSIS — L03119 Cellulitis of unspecified part of limb: Secondary | ICD-10-CM

## 2015-05-31 DIAGNOSIS — L02619 Cutaneous abscess of unspecified foot: Secondary | ICD-10-CM

## 2015-05-31 DIAGNOSIS — L0291 Cutaneous abscess, unspecified: Secondary | ICD-10-CM | POA: Diagnosis present

## 2015-05-31 DIAGNOSIS — F411 Generalized anxiety disorder: Secondary | ICD-10-CM

## 2015-05-31 HISTORY — PX: INCISION AND DRAINAGE ABSCESS: SHX5864

## 2015-05-31 LAB — BASIC METABOLIC PANEL
ANION GAP: 6 (ref 5–15)
BUN: 12 mg/dL (ref 6–20)
CALCIUM: 8 mg/dL — AB (ref 8.9–10.3)
CO2: 25 mmol/L (ref 22–32)
Chloride: 111 mmol/L (ref 101–111)
Creatinine, Ser: 0.83 mg/dL (ref 0.44–1.00)
Glucose, Bld: 92 mg/dL (ref 65–99)
POTASSIUM: 4.3 mmol/L (ref 3.5–5.1)
Sodium: 142 mmol/L (ref 135–145)

## 2015-05-31 LAB — GLUCOSE, CAPILLARY: Glucose-Capillary: 84 mg/dL (ref 65–99)

## 2015-05-31 LAB — CREATININE, SERUM: Creatinine, Ser: 0.77 mg/dL (ref 0.44–1.00)

## 2015-05-31 LAB — CBC
HEMATOCRIT: 37.6 % (ref 36.0–46.0)
Hemoglobin: 13 g/dL (ref 12.0–15.0)
MCH: 30.9 pg (ref 26.0–34.0)
MCHC: 34.6 g/dL (ref 30.0–36.0)
MCV: 89.3 fL (ref 78.0–100.0)
PLATELETS: 261 10*3/uL (ref 150–400)
RBC: 4.21 MIL/uL (ref 3.87–5.11)
RDW: 12.3 % (ref 11.5–15.5)
WBC: 7.8 10*3/uL (ref 4.0–10.5)

## 2015-05-31 LAB — HIV ANTIBODY (ROUTINE TESTING W REFLEX): HIV SCREEN 4TH GENERATION: NONREACTIVE

## 2015-05-31 SURGERY — INCISION AND DRAINAGE, ABSCESS
Anesthesia: General | Laterality: Left

## 2015-05-31 MED ORDER — MIDAZOLAM HCL 2 MG/2ML IJ SOLN
INTRAMUSCULAR | Status: DC | PRN
  Administered 2015-05-31: 2 mg via INTRAVENOUS

## 2015-05-31 MED ORDER — FENTANYL CITRATE (PF) 250 MCG/5ML IJ SOLN
INTRAMUSCULAR | Status: DC | PRN
  Administered 2015-05-31 (×3): 50 ug via INTRAVENOUS
  Administered 2015-05-31 (×2): 25 ug via INTRAVENOUS
  Administered 2015-05-31 (×8): 50 ug via INTRAVENOUS

## 2015-05-31 MED ORDER — DIPHENHYDRAMINE HCL 25 MG PO CAPS
25.0000 mg | ORAL_CAPSULE | Freq: Once | ORAL | Status: AC
  Administered 2015-05-31: 25 mg via ORAL
  Filled 2015-05-31: qty 1

## 2015-05-31 MED ORDER — FENTANYL CITRATE (PF) 250 MCG/5ML IJ SOLN
INTRAMUSCULAR | Status: AC
  Filled 2015-05-31: qty 5

## 2015-05-31 MED ORDER — PROPOFOL 10 MG/ML IV BOLUS
INTRAVENOUS | Status: AC
  Filled 2015-05-31: qty 20

## 2015-05-31 MED ORDER — KETOROLAC TROMETHAMINE 30 MG/ML IJ SOLN
30.0000 mg | Freq: Once | INTRAMUSCULAR | Status: AC
  Administered 2015-05-31: 30 mg via INTRAVENOUS

## 2015-05-31 MED ORDER — FENTANYL CITRATE (PF) 250 MCG/5ML IJ SOLN
INTRAMUSCULAR | Status: AC
  Filled 2015-05-31: qty 10

## 2015-05-31 MED ORDER — SODIUM CHLORIDE 0.9 % IR SOLN
Status: DC | PRN
  Administered 2015-05-31 (×2): 1000 mL

## 2015-05-31 MED ORDER — ONDANSETRON HCL 4 MG/2ML IJ SOLN
4.0000 mg | Freq: Once | INTRAMUSCULAR | Status: AC
  Administered 2015-05-31: 4 mg via INTRAVENOUS

## 2015-05-31 MED ORDER — HEPARIN SODIUM (PORCINE) 5000 UNIT/ML IJ SOLN
5000.0000 [IU] | Freq: Three times a day (TID) | INTRAMUSCULAR | Status: DC
  Administered 2015-05-31 – 2015-06-01 (×3): 5000 [IU] via SUBCUTANEOUS
  Filled 2015-05-31: qty 1

## 2015-05-31 MED ORDER — OXYCODONE HCL 5 MG PO TABS
5.0000 mg | ORAL_TABLET | Freq: Once | ORAL | Status: AC
  Administered 2015-05-31: 5 mg via ORAL

## 2015-05-31 MED ORDER — MIDAZOLAM HCL 2 MG/2ML IJ SOLN
INTRAMUSCULAR | Status: AC
  Filled 2015-05-31: qty 2

## 2015-05-31 MED ORDER — KETOROLAC TROMETHAMINE 30 MG/ML IJ SOLN
INTRAMUSCULAR | Status: AC
  Filled 2015-05-31: qty 1

## 2015-05-31 MED ORDER — PREGABALIN 50 MG PO CAPS
50.0000 mg | ORAL_CAPSULE | Freq: Once | ORAL | Status: AC
  Administered 2015-05-31: 50 mg via ORAL

## 2015-05-31 MED ORDER — CHLORHEXIDINE GLUCONATE 4 % EX LIQD: 60.0000 mL | Freq: Once | CUTANEOUS | Status: AC

## 2015-05-31 MED ORDER — LACTATED RINGERS IV SOLN
INTRAVENOUS | Status: DC | PRN
  Administered 2015-05-31: 11:00:00 via INTRAVENOUS

## 2015-05-31 MED ORDER — PREGABALIN 50 MG PO CAPS
ORAL_CAPSULE | ORAL | Status: AC
  Filled 2015-05-31: qty 1

## 2015-05-31 SURGICAL SUPPLY — 36 items
BAG HAMPER (MISCELLANEOUS) ×2 IMPLANT
BANDAGE ACE 4 STERILE (GAUZE/BANDAGES/DRESSINGS) ×2 IMPLANT
BANDAGE COBAN STERILE 2 (GAUZE/BANDAGES/DRESSINGS) IMPLANT
BANDAGE ESMARK 4X12 BL STRL LF (DISPOSABLE) ×1 IMPLANT
BNDG CONFORM 2 STRL LF (GAUZE/BANDAGES/DRESSINGS) IMPLANT
BNDG ESMARK 4X12 BLUE STRL LF (DISPOSABLE) ×2
BNDG GAUZE ELAST 4 BULKY (GAUZE/BANDAGES/DRESSINGS) ×4 IMPLANT
CLOTH BEACON ORANGE TIMEOUT ST (SAFETY) ×2 IMPLANT
COVER LIGHT HANDLE STERIS (MISCELLANEOUS) ×4 IMPLANT
CUFF TOURNIQUET SINGLE 34IN LL (TOURNIQUET CUFF) ×2 IMPLANT
DRAPE ORTHO 2.5IN SPLIT 77X108 (DRAPES) IMPLANT
DRAPE ORTHO SPLIT 77X108 STRL (DRAPES)
ELECT REM PT RETURN 9FT ADLT (ELECTROSURGICAL) ×2
ELECTRODE REM PT RTRN 9FT ADLT (ELECTROSURGICAL) ×1 IMPLANT
GAUZE SPONGE 4X4 12PLY STRL (GAUZE/BANDAGES/DRESSINGS) IMPLANT
GLOVE BIOGEL PI IND STRL 7.0 (GLOVE) ×1 IMPLANT
GLOVE BIOGEL PI INDICATOR 7.0 (GLOVE) ×1
GLOVE OPTIFIT SS 8.0 STRL (GLOVE) ×2 IMPLANT
GLOVE SKINSENSE NS SZ8.0 LF (GLOVE) ×1
GLOVE SKINSENSE STRL SZ8.0 LF (GLOVE) ×1 IMPLANT
GOWN STRL REUS W/TWL LRG LVL3 (GOWN DISPOSABLE) ×8 IMPLANT
GOWN STRL REUS W/TWL XL LVL3 (GOWN DISPOSABLE) ×2 IMPLANT
INST SET MINOR BONE (KITS) ×2 IMPLANT
KIT ROOM TURNOVER APOR (KITS) ×2 IMPLANT
MANIFOLD NEPTUNE II (INSTRUMENTS) ×2 IMPLANT
MARKER SKIN DUAL TIP RULER LAB (MISCELLANEOUS) ×2 IMPLANT
NS IRRIG 1000ML POUR BTL (IV SOLUTION) ×4 IMPLANT
PACK BASIC LIMB (CUSTOM PROCEDURE TRAY) ×2 IMPLANT
PACK MINOR (CUSTOM PROCEDURE TRAY) IMPLANT
PAD ABD 5X9 TENDERSORB (GAUZE/BANDAGES/DRESSINGS) IMPLANT
PAD ARMBOARD 7.5X6 YLW CONV (MISCELLANEOUS) ×2 IMPLANT
SET BASIN LINEN APH (SET/KITS/TRAYS/PACK) ×2 IMPLANT
SPONGE GAUZE 4X4 12PLY (GAUZE/BANDAGES/DRESSINGS) ×2 IMPLANT
SWAB CULTURE ESWAB REG 1ML (MISCELLANEOUS) ×2 IMPLANT
SWAB CULTURE LIQ STUART DBL (MISCELLANEOUS) ×2 IMPLANT
SYR BULB IRRIGATION 50ML (SYRINGE) ×2 IMPLANT

## 2015-05-31 NOTE — Anesthesia Preprocedure Evaluation (Addendum)
Anesthesia Evaluation  Patient identified by MRN, date of birth, ID band Patient awake  General Assessment Comment:Patient not on beta blockers  Reviewed: Allergy & Precautions, NPO status , Patient's Chart, lab work & pertinent test results  History of Anesthesia Complications Negative for: history of anesthetic complications  Airway Mallampati: I  TM Distance: >3 FB Neck ROM: Full    Dental  (+) Teeth Intact   Pulmonary COPD, Current Smoker,  Lungs clear to auscultation   breath sounds clear to auscultation       Cardiovascular Exercise Tolerance: Good negative cardio ROS   Rhythm:Regular Rate:Normal     Neuro/Psych negative neurological ROS  negative psych ROS   GI/Hepatic GERD  Medicated and Controlled,(+) Hepatitis -, C  Endo/Other    Renal/GU      Musculoskeletal   Abdominal   Peds  Hematology   Anesthesia Other Findings   Reproductive/Obstetrics                            Anesthesia Physical Anesthesia Plan  ASA: II and emergent  Anesthesia Plan: General   Post-op Pain Management:    Induction: Intravenous  Airway Management Planned: LMA  Additional Equipment:   Intra-op Plan:   Post-operative Plan: Extubation in OR  Informed Consent: I have reviewed the patients History and Physical, chart, labs and discussed the procedure including the risks, benefits and alternatives for the proposed anesthesia with the patient or authorized representative who has indicated his/her understanding and acceptance.     Plan Discussed with: Surgeon  Anesthesia Plan Comments:         Anesthesia Quick Evaluation

## 2015-05-31 NOTE — Progress Notes (Signed)
Subjective: Pain slightly better; Had drainage  Ok with I /D    Objective: Vital signs in last 24 hours: Temp:  [98 F (36.7 C)-98.7 F (37.1 C)] 98 F (36.7 C) (02/26 0611) Pulse Rate:  [66-88] 66 (02/26 0611) Resp:  [18-20] 20 (02/26 0611) BP: (92-116)/(46-67) 116/46 mmHg (02/26 0611) SpO2:  [98 %-99 %] 99 % (02/26 0611)  Intake/Output from previous day: 02/25 0701 - 02/26 0700 In: 480 [P.O.:480] Out: -  Intake/Output this shift:     Recent Labs  05/29/15 2222 05/30/15 0414  HGB 15.7* 13.4    Recent Labs  05/29/15 2222 05/30/15 0414  WBC 14.8* 13.9*  RBC 4.97 4.44  HCT 44.4 39.7  PLT 321 296    Recent Labs  05/30/15 0414 05/31/15 0600  NA 142 142  K 3.9 4.3  CL 104 111  CO2 28 25  BUN 13 12  CREATININE 1.00 0.83  GLUCOSE 102* 92  CALCIUM 8.4* 8.0*    Recent Labs  05/30/15 0158  INR 1.01    Assessment/Plan: Abscess left foot  Incision and drainage / left foot   Kathleen Mcpherson 05/31/2015, 10:25 AM

## 2015-05-31 NOTE — Anesthesia Postprocedure Evaluation (Signed)
Anesthesia Post Note  Patient: Kathleen Mcpherson  Procedure(s) Performed: Procedure(s) (LRB): INCISION AND DRAINAGE ABSCESS left foot (Left)  Patient location during evaluation: PACU Anesthesia Type: General Level of consciousness: awake and alert Pain management: pain level not controlled (treating pain in PACU) Vital Signs Assessment: post-procedure vital signs reviewed and stable Respiratory status: spontaneous breathing Cardiovascular status: stable Postop Assessment: no signs of nausea or vomiting    Last Vitals:  Filed Vitals:   05/31/15 0611 05/31/15 1140  BP: 116/46   Pulse: 66 98  Temp: 36.7 C 36.6 C  Resp: 20 22    Last Pain:  Filed Vitals:   05/31/15 1149  PainSc: 6                  Shellee Streng

## 2015-05-31 NOTE — Transfer of Care (Signed)
Immediate Anesthesia Transfer of Care Note  Patient: Kathleen Mcpherson  Procedure(s) Performed: Procedure(s): INCISION AND DRAINAGE ABSCESS left foot (Left)  Patient Location: PACU  Anesthesia Type:General  Level of Consciousness: awake, alert  and oriented  Airway & Oxygen Therapy: Patient Spontanous Breathing and Patient connected to face mask oxygen  Post-op Assessment: Report given to RN and Post -op Vital signs reviewed and stable  Post vital signs: Reviewed and stable  Last Vitals:  Filed Vitals:   05/30/15 2242 05/31/15 0611  BP: 92/62 116/46  Pulse: 70 66  Temp: 37.1 C 36.7 C  Resp: 20 20    Complications: No apparent anesthesia complications

## 2015-05-31 NOTE — Brief Op Note (Signed)
05/29/2015 - 05/31/2015  11:27 AM  PATIENT:  Kathleen Mcpherson  45 y.o. female  PRE-OPERATIVE DIAGNOSIS:  abscess left foot  POST-OPERATIVE DIAGNOSIS:  abscess left foot  PROCEDURE:  Procedure(s): INCISION AND DRAINAGE ABSCESS left foot (Left) below the fascia with out tendon sheath involvement  Findings  Blister was noted over the dorsum of the foot in between the first and second metatarsals.. Material was draining from the foot. The drainage and purulence tracked down to the plantar portion of the foot through the metatarsal heads and metatarsals involving all structures in the surrounding areas. This was a deep abscess.  Cultures anaerobic and aerobic were obtained  Surgeon Romeo Apple   Assisted by none  Anesthesia LMA   Tourniquet was used for 8 minutes at 300 mmHg  Wound was packed with wet saline dressing dry over the top  No complications  Nurses indicated surgical callus correct  Patient stable condition: To PACU  No drains  No local medications  Details of procedure  The site was marked in the preop area the patient was taken to surgery. Anesthesia was administered with LMA no complications  The foot was prepped and draped with Betadine due to open wound  Timeout confirmed that the left foot was the site of surgery  Purulent drainage was noted to be coming out from the area between the first and second digit. Incision was made on the dorsum of the foot through the skin and then blunt dissection was carried out to the cavity which tracked all the way between the first and second metatarsals and beneath both metatarsals. Pressure on these areas produce more purulent material. A curved hemostat was used to dissect through these areas to remove any purulent material  The wound was irrigated with a liter saline  Wet to dry dressing was placed   EBL:  Total I/O In: 300 [I.V.:300] Out: 25 [Blood:25]  Delay start of Pharmacological VTE agent (>24hrs) due to  surgical blood loss or risk of bleeding: not applicable

## 2015-05-31 NOTE — Progress Notes (Signed)
[facility] PROGRESS NOTE  Kathleen Mcpherson VXY:[id number] DOB: 1971/02/04 DOA: 05/29/2015 PCP: Lorelee Market, MD  Assessment/Plan: 1. Severe sepsis secondary to Left foot cellulitis with abscess - Met criteria for sepsis on arrival with WBC 14,800, tachycardia, AKI, RR 22 and left foot abscess as source - received 2 days of treatment with clindamycin and tetanus immunization 05/27/2015  - CT demonstrates evolving deep abscess felt to be amenable to drainage  - Dr. Aline Brochure of orthopedic surgery was consulted and patient taken on 2/26 doe I & D -will follow rec's for wound care and activity post procedure -continue IV antibiotics for another 24 hours -no longer febrile and with normal WBC's now -will continue supportive care and PRN analgesics   2. AKI  - SCr 1.11 on arrival tonight, up from 0.70 2 days prior  - Suspect this represents a mild prerenal azotemia in setting of sepsis and decreased perfusion. -Resolved with fluid resuscitation - Will monitor intermittently patient's renal function  3. COPD  - No Wheezes on exam  - Tobacco cessation recommended as below  -Continue when necessary nebulizer treatment  4. Tobacco abuse  -Smoking cessation counseling has been provided -Nicotine patch has been ordered -Discussed with the patient hospital policy regarding smoking free facility and informed her of inability to allow her to go outside in order to smoke.  5. anxiety: continue use of buspar and klonopin   6. GERD: continue PPI  7. hx of neuropathy: will continue lyrica  Code Status: Full code Family Communication: Husband at bedside Disposition Plan: Home when medically stable. Continue IV antibiotics and follow recommendations from orthopedic surgery for healthcare.   Consultants:  Orthopedic surgery (Dr. Aline Brochure)  Procedures:  I & D of her left foot abscess (by orthopedic surgery) 2/26  Antibiotics:  Vancomycin and Zosyn  2/25  HPI/Subjective: Afebrile, denies chest pain, denies shortness of breath, nausea/vomiting. Status post I and D by orthopedic service on her left foot. Main complaint is need for smoking. Patient no acute distress and denies any other acute complaints.  Objective: Filed Vitals:   05/31/15 1245 05/31/15 1305  BP: 131/61 148/76  Pulse:  75  Temp: 97.6 F (36.4 C) 97.8 F (36.6 C)  Resp: 11 17    Intake/Output Summary (Last 24 hours) at 05/31/15 1507 Last data filed at 05/31/15 1200  Gross per 24 hour  Intake    520 ml  Output    375 ml  Net    145 ml   Filed Weights   05/29/15 2059  Weight: 65.772 kg (145 lb)    Exam:   General:  Afebrile, complaining of some pain in her left foot; no acute distress. Asking to go outside to smoke. S/P I&D by orthopedic service   Cardiovascular: S1 and s2, no rubs, no gallops  Respiratory: good air movement, no wheezing   Abdomen: soft, NT, ND, positive BS  Musculoskeletal: No cyanosis, left foot with clean dressing in place and no significant swelling.  Data Reviewed: Basic Metabolic Panel:  Recent Labs Lab 05/27/15 1308 05/29/15 2222 05/30/15 0158 05/30/15 0414 05/31/15 0600 05/31/15 1305  NA 141 136  --  142 142  --   K 4.2 3.5  --  3.9 4.3  --   CL 103 100*  --  104 111  --   CO2 29 27  --  28 25  --   GLUCOSE 111* 107*  --  102* 92  --   BUN 8 16  --  13 12  --   CREATININE 0.70 1.11*  --  1.00 0.83 0.77  CALCIUM 9.4 8.8*  --  8.4* 8.0*  --   MG  --   --  2.1  --   --   --    CBC:  Recent Labs Lab 05/27/15 1308 05/29/15 2222 05/30/15 0414 05/31/15 1305  WBC 17.3* 14.8* 13.9* 7.8  NEUTROABS 13.3* 9.9* 9.0*  --   HGB 16.5* 15.7* 13.4 13.0  HCT 46.4* 44.4 39.7 37.6  MCV 89.4 89.3 89.4 89.3  PLT 306 321 296 261   CBG:  Recent Labs Lab 05/31/15 0829  GLUCAP 84    No results found for this or any previous visit (from the past 240 hour(s)).   Studies: Ct Foot Left W Contrast  05/29/2015  CLINICAL  DATA:  Left foot cellulitis for 9 days, after stepping on metal wire. Worsening erythema and swelling. Initial encounter. EXAM: CT OF THE LEFT FOOT WITHOUT CONTRAST TECHNIQUE: Multidetector CT imaging of the left foot was performed according to the standard protocol. Multiplanar CT image reconstructions were also generated. COMPARISON:  Left foot radiographs performed 05/27/2015 FINDINGS: There is a large blister tracking between the first and second toes, measuring approximately 3.8 cm in dorsal-plantar length. This appears to be contiguous with fluid tracking more proximally into the forefoot between the first and second toes, and plantarly into the thenar eminence. The fluid mostly fills the medial aspect of the thenar eminence, and tracks adjacent to the first metatarsophalangeal joint, and also laterally plantar to the second metatarsophalangeal joint. The fluid along the plantar aspect of the forefoot is only partially organized, with areas of peripheral enhancement, suggesting an evolving abscess. Surrounding soft tissue inflammation is seen. No additional fluid collections are identified. Mild soft tissue inflammation is noted along the plantar aspect of the forefoot more laterally. No definite osseous erosions are seen. Visualized flexor and extensor tendons are grossly unremarkable in appearance. Visualized vasculature is grossly unremarkable in appearance. Visualized joint spaces are preserved. A small amount of fluid is noted tracking about the medial aspect of the talus, likely reflecting a small joint effusion. The sinus tarsi is unremarkable in appearance. The Achilles tendon remains intact. No edema is seen in Kager's fat pad. IMPRESSION: 1. Large blister tracking between the first and second toes, measuring 3.8 cm in dorsal-plantar length. Per clinical correlation, there is associated purulent drainage. This appears to be contiguous with fluid tracking more proximally into the forefoot between the  first and second toes, and plantarly into the thenar eminence. The fluid mostly fills the medial aspect of the thenar eminence, and tracks adjacent to the first metatarsophalangeal joint. It also tracks laterally plantar to the second metatarsophalangeal joint. 2. This fluid is only partially organized, with areas of peripheral enhancement, suggesting an evolving abscess. As it is contiguous with the large blister at this time, it may be drainable by advancing the large posterior and applying careful pressure along the thenar eminence, about the first and second metatarsophalangeal joints. 3. Surrounding soft tissue inflammation noted, tracking laterally along the plantar aspect of the forefoot. These results were called by telephone at the time of interpretation on 05/29/2015 at 11:46 pm to Dr. Francine Graven, who verbally acknowledged these results. Electronically Signed   By: Garald Balding M.D.   On: 05/29/2015 23:51    Scheduled Meds: . busPIRone  10 mg Oral TID  . clonazePAM  0.5 mg Oral BID  . heparin  5,000 Units Subcutaneous 3  times per day  . ibuprofen  400 mg Oral TID  . magnesium sulfate  2 g Other TID  . nicotine  14 mg Transdermal Daily  . pantoprazole  40 mg Oral Daily  . piperacillin-tazobactam (ZOSYN)  IV  3.375 g Intravenous Q8H  . vancomycin  1,000 mg Intravenous Q12H   Continuous Infusions:    Time spent: 30 minutes    Barton Dubois  [facility] Pager [phone]. If 7PM-7AM, please contact night-coverage at [email or web address], password [facility] 05/31/2015, 3:07 PM  LOS: 1 day

## 2015-05-31 NOTE — Anesthesia Procedure Notes (Signed)
Procedure Name: LMA Insertion Date/Time: 05/31/2015 11:03 AM Performed by: Glynn Octave E Pre-anesthesia Checklist: Patient identified, Patient being monitored, Emergency Drugs available, Timeout performed and Suction available Patient Re-evaluated:Patient Re-evaluated prior to inductionOxygen Delivery Method: Circle System Utilized Preoxygenation: Pre-oxygenation with 100% oxygen Intubation Type: IV induction Ventilation: Mask ventilation without difficulty LMA: LMA inserted LMA Size: 4.0 Number of attempts: 1 Placement Confirmation: positive ETCO2 and breath sounds checked- equal and bilateral

## 2015-05-31 NOTE — Op Note (Addendum)
05/29/2015 - 05/31/2015  11:27 AM  PATIENT:  Kathleen Mcpherson  45 y.o. female  PRE-OPERATIVE DIAGNOSIS:  abscess left foot  POST-OPERATIVE DIAGNOSIS:  abscess left foot  PROCEDURE:  Procedure(s): INCISION AND DRAINAGE ABSCESS left foot (Left) below the fascia with out tendon sheath involvement  Findings  Blister was noted over the dorsum of the foot in between the first and second metatarsals.. Material was draining from the foot. The drainage and purulence tracked down to the plantar portion of the foot through the metatarsal heads and metatarsals involving all structures in the surrounding areas. This was a deep abscess.  Cultures anaerobic and aerobic were obtained  Surgeon Romeo Apple   Assisted by none  Anesthesia LMA   Tourniquet was used for 8 minutes at 300 mmHg  Wound was packed with wet saline dressing dry over the top  No complications  Nurses indicated surgical callus correct  Patient stable condition: To PACU  No drains  No local medications  Details of procedure  The site was marked in the preop area the patient was taken to surgery. Anesthesia was administered with LMA no complications  The foot was prepped and draped with Betadine due to open wound  Timeout confirmed that the left foot was the site of surgery  Purulent drainage was noted to be coming out from the area between the first and second digit. Incision was made on the dorsum of the foot through the skin and then blunt dissection was carried out to the cavity which tracked all the way between the first and second metatarsals and beneath both metatarsals. Pressure on these areas produce more purulent material. A curved hemostat was used to dissect through these areas to remove any purulent material  The wound was irrigated with a liter saline  Wet to dry dressing was placed   EBL:  Total I/O In: 300 [I.V.:300] Out: 25 [Blood:25]  Delay start of Pharmacological VTE agent (>24hrs) due to  surgical blood loss or risk of bleeding: not applicable  Postoperative plan start 3 times a day dressing changes wet to dry tomorrow Continue antibiotics Weightbearing through the heel with postop shoe with physical therapy  [id number]

## 2015-06-01 LAB — BASIC METABOLIC PANEL
Anion gap: 8 (ref 5–15)
BUN: 15 mg/dL (ref 6–20)
CALCIUM: 8.3 mg/dL — AB (ref 8.9–10.3)
CHLORIDE: 107 mmol/L (ref 101–111)
CO2: 29 mmol/L (ref 22–32)
CREATININE: 0.84 mg/dL (ref 0.44–1.00)
GFR calc Af Amer: >60 mL/min (ref >60)
GFR calc non Af Amer: >60 mL/min (ref >60)
GLUCOSE: 99 mg/dL (ref 65–99)
Potassium: 3.7 mmol/L (ref 3.5–5.1)
Sodium: 144 mmol/L (ref 135–145)

## 2015-06-01 LAB — GLUCOSE, CAPILLARY: GLUCOSE-CAPILLARY: 82 mg/dL (ref 65–99)

## 2015-06-01 LAB — CBC
HCT: 37.9 % (ref 36.0–46.0)
Hemoglobin: 12.7 g/dL (ref 12.0–15.0)
MCH: 30.5 pg (ref 26.0–34.0)
MCHC: 33.5 g/dL (ref 30.0–36.0)
MCV: 90.9 fL (ref 78.0–100.0)
PLATELETS: 282 10*3/uL (ref 150–400)
RBC: 4.17 MIL/uL (ref 3.87–5.11)
RDW: 12.4 % (ref 11.5–15.5)
WBC: 7.8 10*3/uL (ref 4.0–10.5)

## 2015-06-01 MED ORDER — AMOXICILLIN-POT CLAVULANATE 875-125 MG PO TABS: 1.0000 | ORAL_TABLET | Freq: Two times a day (BID) | ORAL | Status: DC

## 2015-06-01 MED ORDER — SACCHAROMYCES BOULARDII 250 MG PO CAPS: 250.0000 mg | ORAL_CAPSULE | Freq: Two times a day (BID) | ORAL | Status: DC

## 2015-06-01 MED ORDER — DOXYCYCLINE HYCLATE 100 MG PO TABS: 100.0000 mg | ORAL_TABLET | Freq: Two times a day (BID) | ORAL | Status: AC

## 2015-06-01 MED ORDER — SACCHAROMYCES BOULARDII 250 MG PO CAPS
250.0000 mg | ORAL_CAPSULE | Freq: Two times a day (BID) | ORAL | Status: DC
  Administered 2015-06-01: 250 mg via ORAL
  Filled 2015-06-01: qty 1

## 2015-06-01 MED ORDER — PANTOPRAZOLE SODIUM 40 MG PO TBEC: 40.0000 mg | DELAYED_RELEASE_TABLET | Freq: Every day | ORAL | Status: DC

## 2015-06-01 MED ORDER — OXYCODONE HCL 5 MG PO TABS: 5.0000 mg | ORAL_TABLET | Freq: Four times a day (QID) | ORAL | Status: DC | PRN

## 2015-06-01 MED ORDER — IBUPROFEN 400 MG PO TABS: 400.0000 mg | ORAL_TABLET | Freq: Three times a day (TID) | ORAL | Status: DC | PRN

## 2015-06-01 MED ORDER — AMOXICILLIN-POT CLAVULANATE 875-125 MG PO TABS
1.0000 | ORAL_TABLET | Freq: Two times a day (BID) | ORAL | Status: DC
  Administered 2015-06-01: 1 via ORAL
  Filled 2015-06-01: qty 1

## 2015-06-01 MED ORDER — AMOXICILLIN-POT CLAVULANATE 875-125 MG PO TABS: 1.0000 | ORAL_TABLET | Freq: Two times a day (BID) | ORAL | Status: AC

## 2015-06-01 MED ORDER — DOXYCYCLINE HYCLATE 100 MG PO TABS: 100.0000 mg | ORAL_TABLET | Freq: Two times a day (BID) | ORAL | Status: DC

## 2015-06-01 MED ORDER — DOXYCYCLINE HYCLATE 100 MG PO TABS
100.0000 mg | ORAL_TABLET | Freq: Two times a day (BID) | ORAL | Status: DC
  Administered 2015-06-01: 100 mg via ORAL
  Filled 2015-06-01: qty 1

## 2015-06-01 NOTE — Progress Notes (Signed)
Pt's IV catheter removed and intact. Pt's IV site clean dry and intact. Discharge instructions, medications and follow up appointments reviewed and discussed with patient. Pt verbalized understanding of discharge instructions including medications and follow up appointments. All questions were answered and no further questions at this time. Pt in stable condition and in no acute distress at this time. Pt escorted by nurse tech.

## 2015-06-01 NOTE — Anesthesia Postprocedure Evaluation (Signed)
Anesthesia Post Note  Patient: Kathleen Mcpherson  Procedure(s) Performed: Procedure(s) (LRB): INCISION AND DRAINAGE ABSCESS left foot (Left)  Patient location during evaluation: Nursing Unit Anesthesia Type: General Level of consciousness: awake and alert Pain management: pain level controlled Vital Signs Assessment: post-procedure vital signs reviewed and stable Respiratory status: spontaneous breathing Cardiovascular status: blood pressure returned to baseline Postop Assessment: adequate PO intake Anesthetic complications: no    Last Vitals:  Filed Vitals:   05/31/15 2153 06/01/15 0625  BP: 121/71 127/81  Pulse: 73 76  Temp: 36.6 C 36.7 C  Resp: 20 20    Last Pain:  Filed Vitals:   06/01/15 0638  PainSc: Asleep                 Kelden Lavallee

## 2015-06-01 NOTE — Care Management Note (Signed)
Case Management Note  Patient Details  Name: Kathleen Mcpherson MRN: [id number] Date of Birth: Jun 30, 1970  Subjective/Objective:                  Pt admitted with sepsis. Pt is from home, lives with spouse and is ind with ADL's. Pt has had I & D of her food and will need RW and HH nursing for wound care. Pt has chosen [facility] for DME and [facility] for [facility] services. Pt aware HH has 48 hours to initiate services after DC.   Action/Plan: Alroy Bailiff, of [facility], made aware of referral and will obtain pt info from chart. Katie from [facility], made aware of [facility] referral and  pt info faxed.    Expected Discharge Date:  06/01/15               Expected Discharge Plan:  Home/Self Care  In-House Referral:  NA  Discharge planning Services  CM Consult  Post Acute Care Choice:  Home Health Choice offered to:  Patient  DME Arranged:  Walker rolling DME Agency:  [facility]  HH Arranged:  RN [facility] Agency:  [facility]  Status of Service:  Completed, signed off  Medicare Important Message Given:    yes Date Medicare IM Given:    Medicare IM give by:    Date Additional Medicare IM Given:    Additional Medicare Important Message give by:     If discussed at Long Length of Stay Meetings, dates discussed:    Additional Comments:  Malcolm Metro, RN 06/01/2015, 2:16 PM

## 2015-06-01 NOTE — Care Management Important Message (Signed)
Important Message  Patient Details  Name: Kathleen Mcpherson MRN: [id number] Date of Birth: [birth year]   Medicare Important Message Given:  Yes    Malcolm Metro, RN 06/01/2015, 2:19 PM

## 2015-06-01 NOTE — Evaluation (Signed)
Physical Therapy Evaluation Patient Details Name: Kathleen Mcpherson MRN: [id number] DOB: 1970-09-03 Today's Date: 06/01/2015   History of Present Illness  Pt is a 45 yo F admitted to [facility] c left foot pain, swelling, and erythema worsening over the past week and a half after stepping on copper wires which pierced skin. Underwent incision and drainage of L foot abscess on 2/26.  Clinical Impression  Pt was A&Ox3 and willing to participate in PT. She reported 6/10 pain on VAS, nurse gave pain meds at beginning of session. Pt reports she has been up and going to the bathroom without assistance. She is indep with all bed mobility and modI with transfer and ambulation using a RW. She demonstrates safety with walker placement and is aware of weight bearing precautions at this time. Her strength is WFL in BUE and BLE. She feels she is safe to return home as she is performing at her baseline, aside from the bandage on her LLE. She does not benefit from PT services while at [facility], and is safe to return home with RW and family/neighbor assistance. PT will d/c pt secondary to indep and baseline strength and mobility.    Follow Up Recommendations No PT follow up    Equipment Recommendations  Rolling walker with 5" wheels    Recommendations for Other Services       Precautions / Restrictions Restrictions Weight Bearing Restrictions: Yes LLE Weight Bearing: Weight bearing as tolerated Other Position/Activity Restrictions: Per H&P notes, WBAT with weight through heel.       Mobility  Bed Mobility Overal bed mobility: Independent                Transfers Overall transfer level: Modified independent Equipment used: Rolling walker (2 wheeled)                Ambulation/Gait Ambulation/Gait assistance: Modified independent (Device/Increase time) Ambulation Distance (Feet): 80 Feet Assistive device: Rolling walker (2 wheeled) Gait Pattern/deviations: Step-to pattern     General Gait  Details: Decreased speed secondary to LLE weight bearing limitations and pain  Stairs Stairs:  (Pt feels she is safe to ascend steps into her home with husband assistance.)          Wheelchair Mobility    Modified Rankin (Stroke Patients Only)       Balance Overall balance assessment: No apparent balance deficits (not formally assessed)                                           Pertinent Vitals/Pain Pain Assessment: 0-10 Pain Score: 6  Pain Location: LLE Pain Intervention(s): Monitored during session;RN gave pain meds during session    Home Living Family/patient expects to be discharged to:: Private residence Living Arrangements: Spouse/significant other Available Help at Discharge: Family;Neighbor Type of Home: Mobile home Home Access: Stairs to enter (2, 3" steps) Entrance Stairs-Rails: None   Home Layout: One level Home Equipment: None      Prior Function Level of Independence: Independent               Hand Dominance        Extremity/Trunk Assessment   Upper Extremity Assessment: Overall WFL for tasks assessed           Lower Extremity Assessment: Overall WFL for tasks assessed         Communication   Communication: No difficulties  Cognition Arousal/Alertness: Awake/alert Behavior During Therapy: WFL for tasks assessed/performed Overall Cognitive Status: Within Functional Limits for tasks assessed                      General Comments      Exercises        Assessment/Plan    PT Assessment Patent does not need any further PT services  PT Diagnosis     PT Problem List    PT Treatment Interventions     PT Goals (Current goals can be found in the Care Plan section)      Frequency     Barriers to discharge        Co-evaluation               End of Session Equipment Utilized During Treatment: Gait belt Activity Tolerance: Patient tolerated treatment well Patient left: in bed;with call  bell/phone within reach Nurse Communication: Mobility status         Time: 4098-1191 PT Time Calculation (min) (ACUTE ONLY): 18 min   Charges:   PT Evaluation $PT Eval Low Complexity: 1 Procedure PT Treatments $Therapeutic Activity: 8-22 mins   PT G Codes:          9:10 AM,06-05-15 Marylyn Ishihara PT, DPT [facility] Therapy [phone]

## 2015-06-01 NOTE — Addendum Note (Signed)
Addendum  created 06/01/15 0810 by Moshe Salisbury, CRNA   Modules edited: Clinical Notes   Clinical Notes:  File: [id number]

## 2015-06-01 NOTE — Discharge Summary (Signed)
Physician Discharge Summary  Kathleen Mcpherson YSA:[id number] DOB: 1971/02/19 DOA: 05/29/2015  PCP: Lorelee Market, MD  Admit date: 05/29/2015 Discharge date: 06/01/2015  Time spent: 35 minutes  Recommendations for Outpatient Follow-up:  Repeat BMET to follow electrolytes and renal function  Will recommend referral to pulmonary service for PFT's as an outpatient   Discharge Diagnoses:  Principal Problem:   Severe sepsis ([place])   COPD (chronic obstructive pulmonary disease) ([place])   Cellulitis and abscess of left foot   Tobacco abuse   Acute kidney injury ([place])   GERD   Discharge Condition: stable and improved. Will discharge home with [facility] nurse for wound care and instructions to follow up with orthopedic service in 1 week. Patient will also follow up with PCP in 10 days.   Diet recommendation: regular diet   Filed Weights   05/29/15 2059  Weight: 65.772 kg (145 lb)    History of present illness:  As per Kathleen Mcpherson H&P on 05/29/15 45 y.o. female with PMH of tobacco abuse, COPD, and fibromyalgia presents to the ED with left foot pain, swelling, and erythema worsening over the past week and a half after stepping on copper wires which pierced skin. Patient reports approximately 1-1/2 weeks ago, her husband was working with copper wires and will walking barefoot, she stepped on some pieces of the wire, suffering multiple tiny puncture wounds at the plantar surface of her left MTPs. There was immediate pain, but minimal swelling or erythema in until 05/26/2015 when the foot became acutely erythematous and swollen with marked increase in pain. She sought evaluation at this ED on 05/27/2015, was given tetanus immunization, and discharged home with clindamycin. Unfortunately, despite treatment with clindamycin, pain and swelling have continued to worsen. Patient denies subjective fevers or chills, but notes development of general malaise over the past 2 days. Patient also notes recent worsening in  her chronic dyspnea and wheezing. She attributes this to not using her home nebulizer for the past couple days that she had been preoccupied with the foot pain.  In ED, patient was found to be afebrile, saturating well on room air, with heart rate in the low 100s, and vitals otherwise stable. Basic blood work was sent off and returns notable for a leukocytosis to 15,000 and serum creatinine of 1.11, up from an apparent baseline of 0.7. The left foot was noted to be red, hot, and swollen, with a large tense pus filled bulla between the first and second digits. Patient was sent for CT of the left foot which demonstrates an evolving deep abscess of the left forefoot. Dr. Aline Brochure of orthopedic surgery was consulted from the emergency department and has indicated that he will see the patient in the morning. Vancomycin was administered empirically in the emergency department and the patient will be admitted for ongoing evaluation and management of severe sepsis secondary to skin/soft tissue infection of the left foot.  Hospital Course:  1. Severe sepsis secondary to Left foot cellulitis with abscess - Met criteria for sepsis on arrival with WBC 14,800, tachycardia, AKI, RR 22 and left foot abscess as source -received 2 days of treatment with clindamycin prior to admission and tetanus immunization 05/27/2015  -CT demonstrates evolving deep abscess felt to be amenable to drainage  -Dr. Aline Brochure of orthopedic surgery was consulted and patient taken on 2/26 for I & D -will follow rec's for wound care and activity post procedure -will discharge on doxycycline and augmentin and treat for 10 more days. -septic features resolved  at discharge (Afebrile and with normal WBC's now) -wound care and dressing changes as per orthopedic recommendations   2. AKI  - SCr 1.11 on arrival tonight, up from 0.70 2 days prior to admission  - Suspect this represents a mild prerenal azotemia in setting of sepsis and decreased  perfusion. -Resolved with fluid resuscitation - Will recommend BMET at follow up to reassess electrolytes and renal function   3. COPD  -No Wheezes on exam  -Tobacco cessation recommended as below  -outpatient follow up for PFT's recommended   4. Tobacco abuse  -Smoking cessation counseling has been provided -Nicotine patch was ordered while inpatient   5. anxiety: continue use of buspar and klonopin   6. GERD: continue PPI  7. hx of neuropathy: will continue lyrica  Procedures:  I & D of her left foot abscess (by orthopedic surgery) 2/26  Consultations:  Orthopedic service   Discharge Exam: Filed Vitals:   05/31/15 2153 06/01/15 0625  BP: 121/71 127/81  Pulse: 73 76  Temp: 97.8 F (36.6 C) 98 F (36.7 C)  Resp: 20 20    General: Afebrile, complaining of some pain in her left foot; no acute distress. S/P I&D by orthopedic service on 2/26  Cardiovascular: S1 and s2, no rubs, no gallops  Respiratory: good air movement, no wheezing   Abdomen: soft, NT, ND, positive BS  Musculoskeletal: No cyanosis, left foot with clean dressing in place and no significant swelling or bleeding/discharge appreciated.  Discharge Instructions   Discharge Instructions    Discharge instructions    Complete by:  As directed   No weight bearing in front of your left foot Follow up with PCP in 2 weeks (sooner if needed) Please follow up with Dr. Aline Brochure (orthopedic service) in 1 week Daily dressing changes; do not get foot wet or soak Please take medications as prescribed Stop smoking          Current Discharge Medication List    START taking these medications   Details  amoxicillin-clavulanate (AUGMENTIN) 875-125 MG tablet Take 1 tablet by mouth every 12 (twelve) hours. Qty: 20 tablet, Refills: 0    doxycycline (VIBRA-TABS) 100 MG tablet Take 1 tablet (100 mg total) by mouth every 12 (twelve) hours. Qty: 20 tablet, Refills: 0    ibuprofen (ADVIL,MOTRIN) 400 MG  tablet Take 1 tablet (400 mg total) by mouth every 8 (eight) hours as needed for moderate pain. Qty: 45 tablet, Refills: 0    pantoprazole (PROTONIX) 40 MG tablet Take 1 tablet (40 mg total) by mouth daily. Qty: 30 tablet, Refills: 0    saccharomyces boulardii (FLORASTOR) 250 MG capsule Take 1 capsule (250 mg total) by mouth 2 (two) times daily. Qty: 60 capsule, Refills: 0      CONTINUE these medications which have CHANGED   Details  oxyCODONE (ROXICODONE) 5 MG immediate release tablet Take 1-2 tablets (5-10 mg total) by mouth every 6 (six) hours as needed for severe pain. Qty: 45 tablet, Refills: 0      CONTINUE these medications which have NOT CHANGED   Details  busPIRone (BUSPAR) 10 MG tablet Take 10 mg by mouth 3 (three) times daily. Refills: 1    clonazePAM (KLONOPIN) 0.5 MG tablet Take 0.5 mg by mouth 2 (two) times daily. Refills: 5      STOP taking these medications     clindamycin (CLEOCIN) 300 MG capsule        Allergies  Allergen Reactions  . Asa [Aspirin] Other (See Comments)  G.I.Upset  . Prednisone Itching  . Tylenol [Acetaminophen] Other (See Comments)    G.I. Upset   Follow-up Information    Follow up with Lorelee Market, MD. Schedule an appointment as soon as possible for a visit in 2 weeks.   Specialty:  Family Medicine   Contact information:   [facility] [place] [address] [phone]       Follow up with Arther Abbott, MD In 1 week.   Specialties:  Orthopedic Surgery, Radiology   Contact information:   [address] [place] [address] [phone]       Follow up with [facility].   Specialty:  [place]   Why:  They will call you to make first appointment   Contact information:   [address] [place] [address] [phone]       The results of significant diagnostics from this hospitalization (including imaging, microbiology, ancillary and laboratory) are listed below for  reference.    Significant Diagnostic Studies: Ct Foot Left W Contrast  05/29/2015  CLINICAL DATA:  Left foot cellulitis for 9 days, after stepping on metal wire. Worsening erythema and swelling. Initial encounter. EXAM: CT OF THE LEFT FOOT WITHOUT CONTRAST TECHNIQUE: Multidetector CT imaging of the left foot was performed according to the standard protocol. Multiplanar CT image reconstructions were also generated. COMPARISON:  Left foot radiographs performed 05/27/2015 FINDINGS: There is a large blister tracking between the first and second toes, measuring approximately 3.8 cm in dorsal-plantar length. This appears to be contiguous with fluid tracking more proximally into the forefoot between the first and second toes, and plantarly into the thenar eminence. The fluid mostly fills the medial aspect of the thenar eminence, and tracks adjacent to the first metatarsophalangeal joint, and also laterally plantar to the second metatarsophalangeal joint. The fluid along the plantar aspect of the forefoot is only partially organized, with areas of peripheral enhancement, suggesting an evolving abscess. Surrounding soft tissue inflammation is seen. No additional fluid collections are identified. Mild soft tissue inflammation is noted along the plantar aspect of the forefoot more laterally. No definite osseous erosions are seen. Visualized flexor and extensor tendons are grossly unremarkable in appearance. Visualized vasculature is grossly unremarkable in appearance. Visualized joint spaces are preserved. A small amount of fluid is noted tracking about the medial aspect of the talus, likely reflecting a small joint effusion. The sinus tarsi is unremarkable in appearance. The Achilles tendon remains intact. No edema is seen in Kager's fat pad. IMPRESSION: 1. Large blister tracking between the first and second toes, measuring 3.8 cm in dorsal-plantar length. Per clinical correlation, there is associated purulent drainage.  This appears to be contiguous with fluid tracking more proximally into the forefoot between the first and second toes, and plantarly into the thenar eminence. The fluid mostly fills the medial aspect of the thenar eminence, and tracks adjacent to the first metatarsophalangeal joint. It also tracks laterally plantar to the second metatarsophalangeal joint. 2. This fluid is only partially organized, with areas of peripheral enhancement, suggesting an evolving abscess. As it is contiguous with the large blister at this time, it may be drainable by advancing the large posterior and applying careful pressure along the thenar eminence, about the first and second metatarsophalangeal joints. 3. Surrounding soft tissue inflammation noted, tracking laterally along the plantar aspect of the forefoot. These results were called by telephone at the time of interpretation on 05/29/2015 at 11:46 pm to Dr. Francine Graven, who verbally  acknowledged these results. Electronically Signed   By: Garald Balding M.D.   On: 05/29/2015 23:51   Dg Foot Complete Left  05/27/2015  CLINICAL DATA:  45 year old female who tripped over a copper wire 1 week ago with continued pain redness and swelling about the second toe. Initial encounter. EXAM: LEFT FOOT - COMPLETE 3+ VIEW COMPARISON:  None. FINDINGS: Bone mineralization is within normal limits. Calcaneus intact with mild degenerative spurring. Tarsal bones appear intact. Metatarsals intact. Phalanges intact. Joint spaces and alignment within normal limits. No radiopaque foreign body identified. IMPRESSION: Negative radiographic appearance of the left foot. Electronically Signed   By: Genevie Ann M.D.   On: 05/27/2015 12:53    Microbiology: No results found for this or any previous visit (from the past 240 hour(s)).   Labs: Basic Metabolic Panel:  Recent Labs Lab 05/27/15 1308 05/29/15 2222 05/30/15 0158 05/30/15 0414 05/31/15 0600 05/31/15 1305 06/01/15 0553  NA 141 136  --  142  142  --  144  K 4.2 3.5  --  3.9 4.3  --  3.7  CL 103 100*  --  104 111  --  107  CO2 29 27  --  28 25  --  29  GLUCOSE 111* 107*  --  102* 92  --  99  BUN 8 16  --  13 12  --  15  CREATININE 0.70 1.11*  --  1.00 0.83 0.77 0.84  CALCIUM 9.4 8.8*  --  8.4* 8.0*  --  8.3*  MG  --   --  2.1  --   --   --   --    CBC:  Recent Labs Lab 05/27/15 1308 05/29/15 2222 05/30/15 0414 05/31/15 1305 06/01/15 0553  WBC 17.3* 14.8* 13.9* 7.8 7.8  NEUTROABS 13.3* 9.9* 9.0*  --   --   HGB 16.5* 15.7* 13.4 13.0 12.7  HCT 46.4* 44.4 39.7 37.6 37.9  MCV 89.4 89.3 89.4 89.3 90.9  PLT 306 321 296 261 282    CBG:  Recent Labs Lab 05/31/15 0829 06/01/15 0725  GLUCAP 84 82     Signed:  Barton Dubois MD.  [facility] 06/01/2015, 11:37 AM

## 2015-06-01 NOTE — Progress Notes (Signed)
Pt CBG 82. 

## 2015-06-02 ENCOUNTER — Encounter (HOSPITAL_COMMUNITY): Payer: Self-pay | Admitting: Orthopedic Surgery

## 2015-06-03 ENCOUNTER — Telehealth: Payer: Self-pay | Admitting: *Deleted

## 2015-06-03 LAB — CULTURE, ROUTINE-ABSCESS

## 2015-06-03 NOTE — Telephone Encounter (Signed)
Call received from [facility], nurse with [facility], she states she needs clarification on patient's wound care orders. She wanted to know what you wanted to them to pack the wound with. Also, she advised they do not have enough staff to do daily visits, however, she said they could teach the patient how to do it. Please advise. [phone]

## 2015-06-08 LAB — ANAEROBIC CULTURE

## 2015-06-12 ENCOUNTER — Ambulatory Visit (INDEPENDENT_AMBULATORY_CARE_PROVIDER_SITE_OTHER): Payer: Self-pay | Admitting: Orthopedic Surgery

## 2015-06-12 VITALS — BP 144/67 | Ht 64.0 in | Wt 145.0 lb

## 2015-06-12 DIAGNOSIS — L03119 Cellulitis of unspecified part of limb: Secondary | ICD-10-CM

## 2015-06-12 DIAGNOSIS — L02419 Cutaneous abscess of limb, unspecified: Secondary | ICD-10-CM

## 2015-06-12 MED ORDER — CLINDAMYCIN HCL 150 MG PO CAPS: 150.0000 mg | ORAL_CAPSULE | Freq: Three times a day (TID) | ORAL | Status: DC

## 2015-06-12 MED ORDER — OXYCODONE HCL 5 MG PO TABS: 5.0000 mg | ORAL_TABLET | Freq: Four times a day (QID) | ORAL | Status: DC | PRN

## 2015-06-12 NOTE — Progress Notes (Signed)
Chief Complaint  Patient presents with  . Follow-up    Post op left foot, Iand D. DOS 05-31-15.    BP 144/67 mmHg  Ht 5\' 4"  (1.626 m)  Wt 145 lb (65.772 kg)  BMI 24.88 kg/m2  LMP 11/16/2011  Abscess incision drainage left foot patient doing her own dressing changes twice a week nurse visits antibiotic profile shows that clindamycin is the best antibiotic choice she will switch from Augmentin and the Vibramycin to clindamycin continue Percocet continue dressing changes  She has about a 3 cm x 1 cm open wound between the first and second digit of the foot looks fairly good based on the care she is getting  Recommend continue dressing changes and follow-up for recheck in 2 weeks

## 2015-06-16 ENCOUNTER — Telehealth: Payer: Self-pay | Admitting: Orthopedic Surgery

## 2015-06-16 NOTE — Telephone Encounter (Signed)
Patient called and stated that she felt like she was having a problem with the Clindamycin 150 that she was given on Friday, March 10th.  She said she feels nauseous when she takes the pills and she gets hot only when she takes the medicine.  She doesn't know if she should continue to take it or not.

## 2015-06-17 NOTE — Telephone Encounter (Signed)
Routing to Dr. Harrison to advise 

## 2015-06-18 ENCOUNTER — Other Ambulatory Visit: Payer: Self-pay | Admitting: Orthopedic Surgery

## 2015-06-18 MED ORDER — LEVOFLOXACIN 250 MG PO TABS: 250.0000 mg | ORAL_TABLET | Freq: Every day | ORAL | Status: DC

## 2015-06-18 NOTE — Telephone Encounter (Signed)
Advised patient that Dr Romeo AppleHarrison sent in levaquin for her. Patient advised she has been running low grade temperature, so I advised her to start different antibiotic, and if no better call office Monday morning for an appointment or go to ED if worse over weekend.

## 2015-06-18 NOTE — Telephone Encounter (Signed)
i changed her to levaquin

## 2015-06-26 ENCOUNTER — Ambulatory Visit: Payer: Medicare PPO | Admitting: Orthopedic Surgery

## 2015-06-26 VITALS — BP 99/65 | Ht 64.0 in | Wt 145.0 lb

## 2015-06-26 DIAGNOSIS — L03119 Cellulitis of unspecified part of limb: Secondary | ICD-10-CM

## 2015-06-26 DIAGNOSIS — Z4789 Encounter for other orthopedic aftercare: Secondary | ICD-10-CM

## 2015-06-26 DIAGNOSIS — L02419 Cutaneous abscess of limb, unspecified: Secondary | ICD-10-CM

## 2015-06-26 NOTE — Patient Instructions (Signed)
Stop nystatin Stop levoquin Soak twice a day

## 2015-06-26 NOTE — Progress Notes (Signed)
Chief Complaint  Patient presents with  . Follow-up    Follow up on wound on left foot, DOS 05-31-15.    Postop wound check left foot for second digit. Wound is almost closed  No signs of infection  She is having a rash from the Levaquin we stop the clindamycin because it made her sick  She is not having any pain  She will soak the foot now come back in 4 weeks

## 2015-07-02 ENCOUNTER — Encounter (HOSPITAL_COMMUNITY): Payer: Self-pay | Admitting: Orthopedic Surgery

## 2015-07-27 ENCOUNTER — Ambulatory Visit: Payer: Medicare PPO | Admitting: Orthopedic Surgery

## 2021-01-10 ENCOUNTER — Encounter (HOSPITAL_COMMUNITY): Payer: Self-pay

## 2021-01-10 ENCOUNTER — Observation Stay (HOSPITAL_COMMUNITY)
Admission: EM | Admit: 2021-01-10 | Discharge: 2021-01-11 | Disposition: A | Discharge status: Home / Self Care | Payer: Medicare Other | Attending: Internal Medicine | Admitting: Internal Medicine

## 2021-01-10 ENCOUNTER — Emergency Department (HOSPITAL_COMMUNITY): Payer: Medicare Other

## 2021-01-10 ENCOUNTER — Other Ambulatory Visit: Payer: Self-pay

## 2021-01-10 DIAGNOSIS — K922 Gastrointestinal hemorrhage, unspecified: Secondary | ICD-10-CM | POA: Diagnosis not present

## 2021-01-10 DIAGNOSIS — R7989 Other specified abnormal findings of blood chemistry: Secondary | ICD-10-CM | POA: Diagnosis present

## 2021-01-10 DIAGNOSIS — F17210 Nicotine dependence, cigarettes, uncomplicated: Secondary | ICD-10-CM | POA: Insufficient documentation

## 2021-01-10 DIAGNOSIS — K219 Gastro-esophageal reflux disease without esophagitis: Secondary | ICD-10-CM | POA: Diagnosis present

## 2021-01-10 DIAGNOSIS — B1920 Unspecified viral hepatitis C without hepatic coma: Secondary | ICD-10-CM | POA: Diagnosis present

## 2021-01-10 DIAGNOSIS — J449 Chronic obstructive pulmonary disease, unspecified: Secondary | ICD-10-CM | POA: Diagnosis present

## 2021-01-10 DIAGNOSIS — K449 Diaphragmatic hernia without obstruction or gangrene: Secondary | ICD-10-CM | POA: Insufficient documentation

## 2021-01-10 DIAGNOSIS — K920 Hematemesis: Secondary | ICD-10-CM | POA: Diagnosis present

## 2021-01-10 DIAGNOSIS — K2970 Gastritis, unspecified, without bleeding: Secondary | ICD-10-CM | POA: Insufficient documentation

## 2021-01-10 DIAGNOSIS — D62 Acute posthemorrhagic anemia: Secondary | ICD-10-CM

## 2021-01-10 DIAGNOSIS — K208 Other esophagitis without bleeding: Secondary | ICD-10-CM | POA: Diagnosis not present

## 2021-01-10 DIAGNOSIS — M797 Fibromyalgia: Secondary | ICD-10-CM | POA: Diagnosis present

## 2021-01-10 DIAGNOSIS — E271 Primary adrenocortical insufficiency: Secondary | ICD-10-CM | POA: Diagnosis present

## 2021-01-10 DIAGNOSIS — Z20822 Contact with and (suspected) exposure to covid-19: Secondary | ICD-10-CM | POA: Diagnosis not present

## 2021-01-10 DIAGNOSIS — Z79899 Other long term (current) drug therapy: Secondary | ICD-10-CM | POA: Insufficient documentation

## 2021-01-10 DIAGNOSIS — D649 Anemia, unspecified: Secondary | ICD-10-CM

## 2021-01-10 DIAGNOSIS — I080 Rheumatic disorders of both mitral and aortic valves: Secondary | ICD-10-CM | POA: Diagnosis present

## 2021-01-10 LAB — CBC
HCT: 22.8 % — ABNORMAL LOW (ref 36.0–46.0)
Hemoglobin: 7.5 g/dL — ABNORMAL LOW (ref 12.0–15.0)
MCH: 29.5 pg (ref 26.0–34.0)
MCHC: 32.9 g/dL (ref 30.0–36.0)
MCV: 89.8 fL (ref 80.0–100.0)
Platelets: 140 10*3/uL — ABNORMAL LOW (ref 150–400)
RBC: 2.54 MIL/uL — ABNORMAL LOW (ref 3.87–5.11)
RDW: 14 % (ref 11.5–15.5)
WBC: 11.1 10*3/uL — ABNORMAL HIGH (ref 4.0–10.5)
nRBC: 0 % (ref 0.0–0.2)

## 2021-01-10 LAB — CBC WITH DIFFERENTIAL/PLATELET
Abs Immature Granulocytes: 0.08 10*3/uL — ABNORMAL HIGH (ref 0.00–0.07)
Basophils Absolute: 0.1 10*3/uL (ref 0.0–0.1)
Basophils Relative: 1 %
Eosinophils Absolute: 0 10*3/uL (ref 0.0–0.5)
Eosinophils Relative: 0 %
HCT: 29.5 % — ABNORMAL LOW (ref 36.0–46.0)
Hemoglobin: 9.8 g/dL — ABNORMAL LOW (ref 12.0–15.0)
Immature Granulocytes: 1 %
Lymphocytes Relative: 17 %
Lymphs Abs: 2.4 10*3/uL (ref 0.7–4.0)
MCH: 29.1 pg (ref 26.0–34.0)
MCHC: 33.2 g/dL (ref 30.0–36.0)
MCV: 87.5 fL (ref 80.0–100.0)
Monocytes Absolute: 0.7 10*3/uL (ref 0.1–1.0)
Monocytes Relative: 5 %
Neutro Abs: 10.8 10*3/uL — ABNORMAL HIGH (ref 1.7–7.7)
Neutrophils Relative %: 76 %
Platelets: 236 10*3/uL (ref 150–400)
RBC: 3.37 MIL/uL — ABNORMAL LOW (ref 3.87–5.11)
RDW: 13.9 % (ref 11.5–15.5)
WBC: 14.1 10*3/uL — ABNORMAL HIGH (ref 4.0–10.5)
nRBC: 0 % (ref 0.0–0.2)

## 2021-01-10 LAB — COMPREHENSIVE METABOLIC PANEL
ALT: 42 U/L (ref 0–44)
AST: 53 U/L — ABNORMAL HIGH (ref 15–41)
Albumin: 2.6 g/dL — ABNORMAL LOW (ref 3.5–5.0)
Alkaline Phosphatase: 80 U/L (ref 38–126)
Anion gap: 6 (ref 5–15)
BUN: 24 mg/dL — ABNORMAL HIGH (ref 6–20)
CO2: 28 mmol/L (ref 22–32)
Calcium: 8.3 mg/dL — ABNORMAL LOW (ref 8.9–10.3)
Chloride: 102 mmol/L (ref 98–111)
Creatinine, Ser: 0.68 mg/dL (ref 0.44–1.00)
GFR, Estimated: >60 mL/min (ref >60)
Glucose, Bld: 147 mg/dL — ABNORMAL HIGH (ref 70–99)
Potassium: 3.7 mmol/L (ref 3.5–5.1)
Sodium: 136 mmol/L (ref 135–145)
Total Bilirubin: 1.1 mg/dL (ref 0.3–1.2)
Total Protein: 6.6 g/dL (ref 6.5–8.1)

## 2021-01-10 LAB — PROTIME-INR
INR: 1.2 (ref 0.8–1.2)
Prothrombin Time: 15.3 seconds — ABNORMAL HIGH (ref 11.4–15.2)

## 2021-01-10 LAB — RESP PANEL BY RT-PCR (FLU A&B, COVID) ARPGX2
Influenza A by PCR: NEGATIVE
Influenza B by PCR: NEGATIVE
SARS Coronavirus 2 by RT PCR: NEGATIVE

## 2021-01-10 LAB — LIPASE, BLOOD: Lipase: 18 U/L (ref 11–51)

## 2021-01-10 LAB — ABO/RH: ABO/RH(D): A POS

## 2021-01-10 MED ORDER — PANTOPRAZOLE SODIUM 40 MG IV SOLR
40.0000 mg | Freq: Once | INTRAVENOUS | Status: AC
  Administered 2021-01-10: 40 mg via INTRAVENOUS
  Filled 2021-01-10: qty 40

## 2021-01-10 MED ORDER — INFLUENZA VAC SPLIT QUAD 0.5 ML IM SUSY: 0.5000 mL | PREFILLED_SYRINGE | INTRAMUSCULAR | Status: DC

## 2021-01-10 MED ORDER — ONDANSETRON HCL 4 MG/2ML IJ SOLN: 4.0000 mg | Freq: Four times a day (QID) | INTRAMUSCULAR | Status: DC | PRN

## 2021-01-10 MED ORDER — SODIUM CHLORIDE 0.9 % IV BOLUS
1000.0000 mL | Freq: Once | INTRAVENOUS | Status: AC
  Administered 2021-01-10: 1000 mL via INTRAVENOUS

## 2021-01-10 MED ORDER — PANTOPRAZOLE INFUSION (NEW) - SIMPLE MED
8.0000 mg/h | INTRAVENOUS | Status: DC
  Administered 2021-01-10 (×2): 8 mg/h via INTRAVENOUS
  Filled 2021-01-10 (×5): qty 100

## 2021-01-10 MED ORDER — ONDANSETRON HCL 4 MG PO TABS: 4.0000 mg | ORAL_TABLET | Freq: Four times a day (QID) | ORAL | Status: DC | PRN

## 2021-01-10 MED ORDER — ONDANSETRON HCL 4 MG/2ML IJ SOLN
4.0000 mg | Freq: Once | INTRAMUSCULAR | Status: AC
  Administered 2021-01-10: 4 mg via INTRAVENOUS
  Filled 2021-01-10: qty 2

## 2021-01-10 MED ORDER — IOHEXOL 300 MG/ML  SOLN
100.0000 mL | Freq: Once | INTRAMUSCULAR | Status: AC | PRN
  Administered 2021-01-10: 100 mL via INTRAVENOUS

## 2021-01-10 MED ORDER — SODIUM CHLORIDE 0.9% IV SOLUTION: Freq: Once | INTRAVENOUS | Status: AC

## 2021-01-10 MED ORDER — LACTATED RINGERS IV SOLN: INTRAVENOUS | Status: DC

## 2021-01-10 NOTE — ED Notes (Signed)
Attempted to call report x1. Nurse stated she would call back.  

## 2021-01-10 NOTE — ED Notes (Signed)
Pt returned from CT °

## 2021-01-10 NOTE — ED Provider Notes (Signed)
[facility] EMERGENCY DEPARTMENT Provider Note   CSN: [id number] Arrival date & time: 01/10/21  1223     History Chief Complaint  Patient presents with   Hematemesis    Kathleen Mcpherson is a 50 y.o. female.  HPI  50 year old female with past medical history of COPD, cholecystectomy and reflux presents to the emergency department with reported hematemesis.  Patient states all day yesterday she was having reflux symptoms, taking multiple Tums.  When she woke up this morning she had 3 separate episodes of vomiting bright red blood.  No clots.  Patient is complaining of mild epigastric burning sensation.  No chest pain or shortness of breath.  Patient reports having an EGD about 10 years ago that showed a gastric ulcer.  She states she takes acid suppressing medication.  Denies any recent fever or diarrhea.  Past Medical History:  Diagnosis Date   Addison disease (HCC)    COPD (chronic obstructive pulmonary disease) (HCC)    Fibromyalgia    Hepatitis C     Patient Active Problem List   Diagnosis Date Noted   Abscess 05/31/2015   Cellulitis and abscess of foot 05/30/2015   Severe sepsis (HCC) 05/30/2015   Tobacco abuse 05/30/2015   Acute kidney injury (HCC) 05/30/2015   Abscess of left foot    Cellulitis    Cellulitis of left foot    GERD (gastroesophageal reflux disease) 12/27/2011   Melena 11/10/2011   Hepatitis C 11/10/2011   Addison disease (HCC) 10/27/2011   Jaundice 10/27/2011   ASTHMATIC BRONCHITIS, ACUTE 08/22/2008   EDEMA 05/30/2008   HYPERLIPIDEMIA 04/10/2008   MITRAL REGURGITATION 04/10/2008   ASTHMA 04/10/2008   COPD (chronic obstructive pulmonary disease) (HCC) 04/10/2008   FIBROMYALGIA 04/10/2008   CHEST PAIN 04/10/2008    Past Surgical History:  Procedure Laterality Date   CHOLECYSTECTOMY     ESOPHAGOGASTRODUODENOSCOPY  11/23/2011   Procedure: ESOPHAGOGASTRODUODENOSCOPY (EGD);  Surgeon: Malissa Hippo, MD;  Location: [facility];  Service:  Endoscopy;  Laterality: N/A;  3:25   INCISION AND DRAINAGE ABSCESS Left 05/31/2015   Procedure: INCISION AND DRAINAGE ABSCESS left foot;  Surgeon: Vickki Hearing, MD;  Location: [facility] ORS;  Service: Orthopedics;  Laterality: Left;   ORTHOPEDIC SURGERY     knee surgery     OB History   No obstetric history on file.     No family history on file.  Social History   Tobacco Use   Smoking status: Every Day    Packs/day: 0.50    Types: Cigarettes   Smokeless tobacco: Never   Tobacco comments:    1 pack a day.  Vaping Use   Vaping Use: Some days  Substance Use Topics   Alcohol use: No   Drug use: No    Comment: In the past, she smoke cocaine and marijuana. No IV drugs.    Home Medications Prior to Admission medications   Medication Sig Start Date End Date Taking? Authorizing Provider  busPIRone (BUSPAR) 10 MG tablet Take 10 mg by mouth 3 (three) times daily. 03/03/15   [provider]  clindamycin (CLEOCIN) 150 MG capsule Take 1 capsule (150 mg total) by mouth 3 (three) times daily. 06/12/15   Vickki Hearing, MD  clonazePAM (KLONOPIN) 0.5 MG tablet Take 0.5 mg by mouth 2 (two) times daily. 05/22/15   [provider]  ibuprofen (ADVIL,MOTRIN) 400 MG tablet Take 1 tablet (400 mg total) by mouth every 8 (eight) hours as needed for moderate pain.  06/01/15   Vassie Loll, MD  levofloxacin (LEVAQUIN) 250 MG tablet Take 1 tablet (250 mg total) by mouth daily. 06/18/15   Vickki Hearing, MD  oxyCODONE (ROXICODONE) 5 MG immediate release tablet Take 1-2 tablets (5-10 mg total) by mouth every 6 (six) hours as needed for severe pain. 06/12/15   Vickki Hearing, MD  pantoprazole (PROTONIX) 40 MG tablet Take 1 tablet (40 mg total) by mouth daily. 06/01/15   Vassie Loll, MD  saccharomyces boulardii (FLORASTOR) 250 MG capsule Take 1 capsule (250 mg total) by mouth 2 (two) times daily. 06/01/15   Vassie Loll, MD    Allergies    Asa [aspirin], Prednisone, and  Tylenol [acetaminophen]  Review of Systems   Review of Systems  Constitutional:  Positive for fatigue. Negative for chills and fever.  HENT:  Negative for congestion.   Eyes:  Negative for visual disturbance.  Respiratory:  Negative for shortness of breath.   Cardiovascular:  Negative for chest pain.  Gastrointestinal:  Positive for abdominal pain, nausea and vomiting. Negative for abdominal distention, blood in stool and diarrhea.  Genitourinary:  Negative for dysuria.  Skin:  Negative for rash.  Neurological:  Negative for headaches.   Physical Exam Updated Vital Signs BP (!) 101/57   Pulse (!) 109   Temp 97.8 F (36.6 C) (Oral)   Resp 14   Ht 5\' 4"  (1.626 m)   Wt 68 kg   LMP 11/16/2011   SpO2 98%   BMI 25.75 kg/m   Physical Exam Vitals and nursing note reviewed.  Constitutional:      Appearance: Normal appearance. She is ill-appearing.  HENT:     Head: Normocephalic.     Mouth/Throat:     Mouth: Mucous membranes are moist.  Cardiovascular:     Rate and Rhythm: Normal rate.  Pulmonary:     Effort: Pulmonary effort is normal. No respiratory distress.  Abdominal:     General: There is no distension.     Palpations: Abdomen is soft.     Tenderness: There is no guarding.     Comments: Mild epigastric tenderness  Skin:    General: Skin is warm.  Neurological:     Mental Status: She is alert and oriented to person, place, and time. Mental status is at baseline.  Psychiatric:        Mood and Affect: Mood normal.    ED Results / Procedures / Treatments   Labs (all labs ordered are listed, but only abnormal results are displayed) Labs Reviewed  CBC WITH DIFFERENTIAL/PLATELET - Abnormal; Notable for the following components:      Result Value   WBC 14.1 (*)    RBC 3.37 (*)    Hemoglobin 9.8 (*)    HCT 29.5 (*)    Neutro Abs 10.8 (*)    Abs Immature Granulocytes 0.08 (*)    All other components within normal limits  COMPREHENSIVE METABOLIC PANEL - Abnormal;  Notable for the following components:   Glucose, Bld 147 (*)    BUN 24 (*)    Calcium 8.3 (*)    Albumin 2.6 (*)    AST 53 (*)    All other components within normal limits  PROTIME-INR - Abnormal; Notable for the following components:   Prothrombin Time 15.3 (*)    All other components within normal limits  RESP PANEL BY RT-PCR (FLU A&B, COVID) ARPGX2  LIPASE, BLOOD  TYPE AND SCREEN    EKG None  Radiology No results  found.  Procedures .Critical Care Performed by: Rozelle Logan, DO Authorized by: Rozelle Logan, DO   Critical care provider statement:    Critical care time (minutes):  45   Critical care time was exclusive of:  Separately billable procedures and treating other patients   Critical care was necessary to treat or prevent imminent or life-threatening deterioration of the following conditions:  Shock and circulatory failure   Critical care was time spent personally by me on the following activities:  Discussions with consultants, evaluation of patient's response to treatment, examination of patient, ordering and performing treatments and interventions, ordering and review of laboratory studies, ordering and review of radiographic studies, pulse oximetry, re-evaluation of patient's condition, obtaining history from patient or surrogate and review of old charts   Medications Ordered in ED Medications  sodium chloride 0.9 % bolus 1,000 mL (1,000 mLs Intravenous New Bag/Given 01/10/21 1318)  ondansetron (ZOFRAN) injection 4 mg (4 mg Intravenous Given 01/10/21 1318)  pantoprazole (PROTONIX) injection 40 mg (40 mg Intravenous Given 01/10/21 1322)  iohexol (OMNIPAQUE) 300 MG/ML solution 100 mL (100 mLs Intravenous Contrast Given 01/10/21 1416)    ED Course  I have reviewed the triage vital signs and the nursing notes.  Pertinent labs & imaging results that were available during my care of the patient were reviewed by me and considered in my medical decision making (see  chart for details).    MDM Rules/Calculators/A&P                           50 year old female presents emergency department after episodes of hematemesis.  She reports a history of gastric ulcers.  Going off of an EGD from 2013 there is mention of erosive/ulcerative esophagitis but no mention of ulcers.  Hepatitis C and H. pylori has been mentioned in her chart as well, unclear if these are active problems.  Patient no longer follows with GI.  Complaining of mild epigastric discomfort.  Tachycardic and hypotensive on arrival, fluid responsive.  Blood work shows an anemia of 9.8, down from a baseline of 12-13.  Patient is not on any anticoagulation.  Metabolic and abdominal labs are normal.  After liter of fluids blood pressure normalized and heart rate improved.  Patient had 1 episode of dry heaving and spitting up small amount of blood while here in the department.  CT the abdomen pelvis identifies a large hiatal hernia but no other acute findings.  Consulted with on-call gastroenterologist Dr. Marletta Lor, he recommends fluid hydration, clear liquid diet and Protonix.  Plans for endoscopy tomorrow.  Patient's blood pressure remained stable, last one 122/65 and her tachycardia is improved, last heart rate was 110 on my evaluation.  We will continue therapy and admit medically.  Patients evaluation and results requires admission for further treatment and care. Patient agrees with admission plan, offers no new complaints and is stable/unchanged at time of admit.  Final Clinical Impression(s) / ED Diagnoses Final diagnoses:  None    Rx / DC Orders ED Discharge Orders     None        Rozelle Logan, DO 01/10/21 1503

## 2021-01-10 NOTE — ED Triage Notes (Signed)
Pt reports vomiting blood x1 day. Pt reports vomiting blood 1 hour ago. No vomiting at this time. Reports abdomen/throat pain x1week.

## 2021-01-10 NOTE — Progress Notes (Signed)
Patient has been a yellow MEWS this shift. MD has been made aware as well as charge nurse.  Currently patient responds to voice and stimulation. She is receiving blood for low hgb.

## 2021-01-10 NOTE — H&P (Signed)
History and Physical  Kathleen Mcpherson XLK:[id number] DOB: 1970/12/24 DOA: 01/10/2021  Referring physician: Dr Wilkie Aye, ED physician PCP: Evelene Croon, MD  Outpatient Specialists:   Patient Coming From: home  Chief Complaint: vomiting blood  HPI: Kathleen Mcpherson is a 50 y.o. female with a history of Addison's disease (not currently on medications), COPD, fibromyalgia, GERD, history of hepatitis C.  Patient seen for 3 episodes of hematemesis that started earlier today.  Patient vomited large amount of blood -states that it was frank blood.  No palliating or provoking factors.  She was having some upper abdominal pain in the epigastric and right and left upper quadrants.  No radiation to the pain.  Pain is described as cramping.  Denies consistent NSAID use, alcohol use, steroid use.  She does have a history of gastric ulcer several years ago.  She did have an EGD approximately 10 years ago erosive esophagitis.  Emergency Department Course: Patient's blood pressure initially low.  She was started with IV fluid bolus started on Protonix drip.  CBC shows hemoglobin is 9.8.  AST slightly elevated at 53 and prothrombin time 15.3, INR 1.2.  Review of Systems:   Pt denies any fevers, chills, nausea, diarrhea, constipation, shortness of breath, dyspnea on exertion, orthopnea, cough, wheezing, palpitations, headache, vision changes, lightheadedness, dizziness, melena, rectal bleeding.  Review of systems are otherwise negative  Past Medical History:  Diagnosis Date   Addison disease (HCC)    COPD (chronic obstructive pulmonary disease) (HCC)    Fibromyalgia    Hepatitis C    Past Surgical History:  Procedure Laterality Date   CHOLECYSTECTOMY     ESOPHAGOGASTRODUODENOSCOPY  11/23/2011   Procedure: ESOPHAGOGASTRODUODENOSCOPY (EGD);  Surgeon: Malissa Hippo, MD;  Location: [facility];  Service: Endoscopy;  Laterality: N/A;  3:25   INCISION AND DRAINAGE ABSCESS Left 05/31/2015   Procedure:  INCISION AND DRAINAGE ABSCESS left foot;  Surgeon: Vickki Hearing, MD;  Location: [facility];  Service: Orthopedics;  Laterality: Left;   ORTHOPEDIC SURGERY     knee surgery   Social History:  reports that she has been smoking cigarettes. She has been smoking an average of .5 packs per day. She has never used smokeless tobacco. She reports that she does not drink alcohol and does not use drugs. Patient lives at home  Allergies  Allergen Reactions   Asa [Aspirin] Other (See Comments)    G.I.Upset   Prednisone Itching   Tylenol [Acetaminophen] Other (See Comments)    G.IDorena Dew    Family history reviewed with patient, no pertinent family history  Prior to Admission medications   Medication Sig Start Date End Date Taking? Authorizing Provider  busPIRone (BUSPAR) 10 MG tablet Take 10 mg by mouth 3 (three) times daily. 03/03/15   [provider]  clindamycin (CLEOCIN) 150 MG capsule Take 1 capsule (150 mg total) by mouth 3 (three) times daily. 06/12/15   Vickki Hearing, MD  clonazePAM (KLONOPIN) 0.5 MG tablet Take 0.5 mg by mouth 2 (two) times daily. 05/22/15   [provider]  ibuprofen (ADVIL,MOTRIN) 400 MG tablet Take 1 tablet (400 mg total) by mouth every 8 (eight) hours as needed for moderate pain. 06/01/15   Vassie Loll, MD  levofloxacin (LEVAQUIN) 250 MG tablet Take 1 tablet (250 mg total) by mouth daily. 06/18/15   Vickki Hearing, MD  oxyCODONE (ROXICODONE) 5 MG immediate release tablet Take 1-2 tablets (5-10 mg total) by mouth every 6 (six) hours as needed for  severe pain. 06/12/15   Vickki Hearing, MD  pantoprazole (PROTONIX) 40 MG tablet Take 1 tablet (40 mg total) by mouth daily. 06/01/15   Vassie Loll, MD  saccharomyces boulardii (FLORASTOR) 250 MG capsule Take 1 capsule (250 mg total) by mouth 2 (two) times daily. 06/01/15   Vassie Loll, MD    Physical Exam: BP 125/79   Pulse (!) 111   Temp 97.8 F (36.6 C) (Oral)   Resp 20   Ht 5\' 4"   (1.626 m)   Wt 68 kg   LMP 11/16/2011   SpO2 100%   BMI 25.75 kg/m   General: Middle-age female. Awake and alert and oriented x3. No acute cardiopulmonary distress.  HEENT: Normocephalic atraumatic.  Right and left ears normal in appearance.  Pupils equal, round, reactive to light. Extraocular muscles are intact. Sclerae anicteric and noninjected.  Moist mucosal membranes. No mucosal lesions.  Neck: Neck supple without lymphadenopathy. No carotid bruits. No masses palpated.  Cardiovascular: Regular rate.  3 out of 6 blowing holosystolic murmur at the mitral valve area.  No murmurs, rubs, gallops auscultated. No JVD.  Respiratory: Good respiratory effort with no wheezes, rales, rhonchi. Lungs clear to auscultation bilaterally.  No accessory muscle use. Abdomen: Soft, mild tenderness in the right upper, left upper and epigastric areas of the abdomen, nondistended. Active bowel sounds. No masses or hepatosplenomegaly  Skin: No rashes, lesions, or ulcerations.  Dry, warm to touch. 2+ dorsalis pedis and radial pulses. Musculoskeletal: No calf or leg pain. All major joints not erythematous nontender.  No upper or lower joint deformation.  Good ROM.  No contractures  Psychiatric: Intact judgment and insight. Pleasant and cooperative. Neurologic: No focal neurological deficits. Strength is 5/5 and symmetric in upper and lower extremities.  Cranial nerves II through XII are grossly intact.           Labs on Admission: I have personally reviewed following labs and imaging studies  CBC: Recent Labs  Lab 01/10/21 1300  WBC 14.1*  NEUTROABS 10.8*  HGB 9.8*  HCT 29.5*  MCV 87.5  PLT 236   Basic Metabolic Panel: Recent Labs  Lab 01/10/21 1300  NA 136  K 3.7  CL 102  CO2 28  GLUCOSE 147*  BUN 24*  CREATININE 0.68  CALCIUM 8.3*   GFR: Estimated Creatinine Clearance: 80.6 mL/min (by C-G formula based on SCr of 0.68 mg/dL). Liver Function Tests: Recent Labs  Lab 01/10/21 1300  AST  53*  ALT 42  ALKPHOS 80  BILITOT 1.1  PROT 6.6  ALBUMIN 2.6*   Recent Labs  Lab 01/10/21 1300  LIPASE 18   No results for input(s): AMMONIA in the last 168 hours. Coagulation Profile: Recent Labs  Lab 01/10/21 1300  INR 1.2   Cardiac Enzymes: No results for input(s): CKTOTAL, CKMB, CKMBINDEX, TROPONINI in the last 168 hours. BNP (last 3 results) No results for input(s): PROBNP in the last 8760 hours. HbA1C: No results for input(s): HGBA1C in the last 72 hours. CBG: No results for input(s): GLUCAP in the last 168 hours. Lipid Profile: No results for input(s): CHOL, HDL, LDLCALC, TRIG, CHOLHDL, LDLDIRECT in the last 72 hours. Thyroid Function Tests: No results for input(s): TSH, T4TOTAL, FREET4, T3FREE, THYROIDAB in the last 72 hours. Anemia Panel: No results for input(s): VITAMINB12, FOLATE, FERRITIN, TIBC, IRON, RETICCTPCT in the last 72 hours. Urine analysis:    Component Value Date/Time   COLORURINE AMBER (A) 10/25/2011 2109   APPEARANCEUR CLEAR 10/25/2011 2109  LABSPEC >1.030 (H) 10/25/2011 2109   PHURINE 5.5 10/25/2011 2109   GLUCOSEU 100 (A) 10/25/2011 2109   HGBUR NEGATIVE 10/25/2011 2109   BILIRUBINUR MODERATE (A) 10/25/2011 2109   KETONESUR NEGATIVE 10/25/2011 2109   PROTEINUR NEGATIVE 10/25/2011 2109   UROBILINOGEN 1.0 10/25/2011 2109   NITRITE NEGATIVE 10/25/2011 2109   LEUKOCYTESUR NEGATIVE 10/25/2011 2109   Sepsis Labs: @LABRCNTIP (procalcitonin:4,lacticidven:4) ) Recent Results (from the past 240 hour(s))  Resp Panel by RT-PCR (Flu A&B, Covid) Nasopharyngeal Swab     Status: None   Collection Time: 01/10/21  1:31 PM   Specimen: Nasopharyngeal Swab; Nasopharyngeal(NP) swabs in vial transport medium  Result Value Ref Range Status   SARS Coronavirus 2 by RT PCR NEGATIVE NEGATIVE Final    Comment: (NOTE) SARS-CoV-2 target nucleic acids are NOT DETECTED.  The SARS-CoV-2 RNA is generally detectable in upper respiratory specimens during the acute  phase of infection. The lowest concentration of SARS-CoV-2 viral copies this assay can detect is 138 copies/mL. A negative result does not preclude SARS-Cov-2 infection and should not be used as the sole basis for treatment or other patient management decisions. A negative result may occur with  improper specimen collection/handling, submission of specimen other than nasopharyngeal swab, presence of viral mutation(s) within the areas targeted by this assay, and inadequate number of viral copies(<138 copies/mL). A negative result must be combined with clinical observations, patient history, and epidemiological information. The expected result is Negative.  Fact Sheet for Patients:  03/12/21  Fact Sheet for Healthcare Providers:  BloggerCourse.com  This test is no t yet approved or cleared by the [employer] FDA and  has been authorized for detection and/or diagnosis of SARS-CoV-2 by FDA under an Emergency Use Authorization (EUA). This EUA will remain  in effect (meaning this test can be used) for the duration of the COVID-19 declaration under Section 564(b)(1) of the Act, 21 U.S.C.section 360bbb-3(b)(1), unless the authorization is terminated  or revoked sooner.       Influenza A by PCR NEGATIVE NEGATIVE Final   Influenza B by PCR NEGATIVE NEGATIVE Final    Comment: (NOTE) The Xpert Xpress SARS-CoV-2/FLU/RSV plus assay is intended as an aid in the diagnosis of influenza from Nasopharyngeal swab specimens and should not be used as a sole basis for treatment. Nasal washings and aspirates are unacceptable for Xpert Xpress SARS-CoV-2/FLU/RSV testing.  Fact Sheet for Patients: [place]  Fact Sheet for Healthcare Providers: BloggerCourse.com  This test is not yet approved or cleared by the [employer] FDA and has been authorized for detection and/or diagnosis of  SARS-CoV-2 by FDA under an Emergency Use Authorization (EUA). This EUA will remain in effect (meaning this test can be used) for the duration of the COVID-19 declaration under Section 564(b)(1) of the Act, 21 U.S.C. section 360bbb-3(b)(1), unless the authorization is terminated or revoked.  Performed at [facility], [address] [place], [place]      Radiological Exams on Admission: CT Abdomen Pelvis W Contrast  Result Date: 01/10/2021 CLINICAL DATA:  Acute epigastric pain.  Assess for gastric ulcer. EXAM: CT ABDOMEN AND PELVIS WITH CONTRAST TECHNIQUE: Multidetector CT imaging of the abdomen and pelvis was performed using the standard protocol following bolus administration of intravenous contrast. CONTRAST:  03/12/2021 OMNIPAQUE IOHEXOL 300 MG/ML  SOLN COMPARISON:  October 25, 2011 FINDINGS: Lower chest: No acute abnormality. Hepatobiliary: No focal liver abnormality is seen. Status post cholecystectomy. No biliary dilatation. Pancreas: Unremarkable. No pancreatic ductal dilatation or surrounding inflammatory changes. Spleen: Normal  in size without focal abnormality. Adrenals/Urinary Tract: Adrenal glands are unremarkable. Kidneys are normal, without renal calculi, focal lesion, or hydronephrosis. Bladder is unremarkable. Stomach/Bowel: Moderate to large hiatal hernia is identified unchanged compared prior exam. The stomach is otherwise unremarkable. There is no small bowel obstruction. Colon is normal. Moderate bowel content is identified throughout colon. The appendix is not seen but no inflammation is noted around cecum. Vascular/Lymphatic: Aortic atherosclerosis. No enlarged abdominal or pelvic lymph nodes. Reproductive: Uterus and bilateral adnexa are unremarkable. Other: None. Musculoskeletal: No acute abnormality. IMPRESSION: 1. No acute abnormality identified in the abdomen and pelvis. 2. Moderate to large hiatal hernia unchanged compared prior exam. 3. Moderate bowel content is  identified throughout colon. This can be seen in constipation. Aortic Atherosclerosis (ICD10-I70.0). Electronically Signed   By: Sherian Rein M.D.   On: 01/10/2021 14:46    EKG: Independently reviewed.  Sinus tachycardia with early R wave progression.  No acute ST changes.  Prolonged QT interval.    Assessment/Plan: Principal Problem:   Upper GI bleed Active Problems:   MITRAL REGURGITATION   COPD (chronic obstructive pulmonary disease) (HCC)   Fibromyalgia   Addison disease (HCC)   Hepatitis C   GERD (gastroesophageal reflux disease)   Elevated LFTs    This patient was discussed with the ED physician, including pertinent vitals, physical exam findings, labs, and imaging.  We also discussed care given by the ED provider.  Upper GI bleed with hematemesis Observation Recheck CBC tonight and tomorrow morning GI consulted Continue Protonix drip No apparent etiology for the patients bleeding N.p.o. for EGD tomorrow History of hepatitis C Likely etiology of the patient's elevated transaminases Elevated transaminases History of Addison's disease Not currently on treatment.  Patient was on chronic steroids at 1 point, but stopped these and has been doing fine since stopping the steroids. COPD Compensated Mitral valve regurgitation Stable  DVT prophylaxis: SCDs Consultants: GI Code Status: Full code Family Communication: Patient's partner present during interview and exam Disposition Plan: Patient to return home following evaluation   Levie Heritage, DO

## 2021-01-10 NOTE — ED Notes (Signed)
Patient transported to CT 

## 2021-01-11 ENCOUNTER — Observation Stay (HOSPITAL_COMMUNITY): Payer: Medicare Other | Admitting: Anesthesiology

## 2021-01-11 ENCOUNTER — Telehealth: Payer: Self-pay | Admitting: Gastroenterology

## 2021-01-11 ENCOUNTER — Other Ambulatory Visit: Payer: Self-pay

## 2021-01-11 ENCOUNTER — Encounter (HOSPITAL_COMMUNITY): Payer: Self-pay | Admitting: Family Medicine

## 2021-01-11 ENCOUNTER — Encounter (HOSPITAL_COMMUNITY)
Admission: EM | Disposition: A | Discharge status: Home / Self Care | Payer: Self-pay | Source: Home / Self Care | Attending: Emergency Medicine

## 2021-01-11 DIAGNOSIS — B1920 Unspecified viral hepatitis C without hepatic coma: Secondary | ICD-10-CM | POA: Diagnosis not present

## 2021-01-11 DIAGNOSIS — K922 Gastrointestinal hemorrhage, unspecified: Secondary | ICD-10-CM

## 2021-01-11 DIAGNOSIS — K208 Other esophagitis without bleeding: Secondary | ICD-10-CM | POA: Diagnosis not present

## 2021-01-11 DIAGNOSIS — D62 Acute posthemorrhagic anemia: Secondary | ICD-10-CM | POA: Diagnosis not present

## 2021-01-11 DIAGNOSIS — Z20822 Contact with and (suspected) exposure to covid-19: Secondary | ICD-10-CM | POA: Diagnosis not present

## 2021-01-11 DIAGNOSIS — D649 Anemia, unspecified: Secondary | ICD-10-CM

## 2021-01-11 DIAGNOSIS — K2970 Gastritis, unspecified, without bleeding: Secondary | ICD-10-CM | POA: Diagnosis not present

## 2021-01-11 DIAGNOSIS — K449 Diaphragmatic hernia without obstruction or gangrene: Secondary | ICD-10-CM | POA: Diagnosis not present

## 2021-01-11 HISTORY — PX: ESOPHAGOGASTRODUODENOSCOPY (EGD) WITH PROPOFOL: SHX5813

## 2021-01-11 LAB — CBC
HCT: 26.6 % — ABNORMAL LOW (ref 36.0–46.0)
Hemoglobin: 8.9 g/dL — ABNORMAL LOW (ref 12.0–15.0)
MCH: 29.3 pg (ref 26.0–34.0)
MCHC: 33.5 g/dL (ref 30.0–36.0)
MCV: 87.5 fL (ref 80.0–100.0)
Platelets: 125 10*3/uL — ABNORMAL LOW (ref 150–400)
RBC: 3.04 MIL/uL — ABNORMAL LOW (ref 3.87–5.11)
RDW: 14.2 % (ref 11.5–15.5)
WBC: 7.3 10*3/uL (ref 4.0–10.5)
nRBC: 0 % (ref 0.0–0.2)

## 2021-01-11 LAB — PREPARE RBC (CROSSMATCH)

## 2021-01-11 LAB — CORTISOL-AM, BLOOD: Cortisol - AM: 9.4 ug/dL (ref 6.7–22.6)

## 2021-01-11 LAB — HIV ANTIBODY (ROUTINE TESTING W REFLEX): HIV Screen 4th Generation wRfx: NONREACTIVE

## 2021-01-11 SURGERY — ESOPHAGOGASTRODUODENOSCOPY (EGD) WITH PROPOFOL
Anesthesia: General

## 2021-01-11 MED ORDER — SODIUM CHLORIDE 0.9 % IV SOLN
INTRAVENOUS | Status: DC | PRN
  Administered 2021-01-11: 80 ug via INTRAVENOUS

## 2021-01-11 MED ORDER — LACTATED RINGERS IV SOLN: INTRAVENOUS | Status: DC

## 2021-01-11 MED ORDER — PROPOFOL 10 MG/ML IV BOLUS
INTRAVENOUS | Status: DC | PRN
  Administered 2021-01-11: 100 mg via INTRAVENOUS
  Administered 2021-01-11: 100 ug/kg/min via INTRAVENOUS

## 2021-01-11 MED ORDER — PANTOPRAZOLE SODIUM 40 MG PO TBEC: 40.0000 mg | DELAYED_RELEASE_TABLET | Freq: Two times a day (BID) | ORAL | 3 refills | Status: DC

## 2021-01-11 MED ORDER — SODIUM CHLORIDE 0.9 % IV SOLN: INTRAVENOUS | Status: DC

## 2021-01-11 MED ORDER — LIDOCAINE HCL (CARDIAC) PF 100 MG/5ML IV SOSY
PREFILLED_SYRINGE | INTRAVENOUS | Status: DC | PRN
  Administered 2021-01-11: 100 mg via INTRAVENOUS

## 2021-01-11 MED ORDER — SUCRALFATE 1 G PO TABS: 1.0000 g | ORAL_TABLET | Freq: Four times a day (QID) | ORAL | 0 refills | Status: DC

## 2021-01-11 NOTE — Progress Notes (Signed)
PROGRESS NOTE    [name]  UMP:[id number] DOB: [birth year] DOA: 01/10/2021 PCP: Evelene Croon, MD   Brief Narrative:   Kathleen Mcpherson is a 50 y.o. female with a history of Addison's disease (not currently on medications), COPD, fibromyalgia, GERD, history of hepatitis C.  Patient seen for 3 episodes of hematemesis that started earlier today.  Plan is for endoscopy today.  She has received 2 unit PRBC transfusion and is currently on Protonix drip.  Assessment & Plan:   Principal Problem:   Upper GI bleed Active Problems:   MITRAL REGURGITATION   COPD (chronic obstructive pulmonary disease) (HCC)   Fibromyalgia   Addison disease (HCC)   Hepatitis C   GERD (gastroesophageal reflux disease)   Elevated LFTs   Acute blood loss anemia secondary to hematemesis -EGD per GI 10/10 -Status post 2 unit PRBC transfusion -Continue close monitoring  History of hepatitis C -Per GI -Outpatient follow-up  COPD -Bronchodilators as needed  Addison's disease -Patient not currently on treatment, but was on chronic steroids at 1 point  GERD -Currently on PPI infusion   DVT prophylaxis: SCDs Code Status: Full Family Communication: Tried calling husband with no response 10/10 Disposition Plan:  Status is: Observation  The patient will require care spanning > 2 midnights and should be moved to inpatient because: Ongoing diagnostic testing needed not appropriate for outpatient work up, IV treatments appropriate due to intensity of illness or inability to take PO, and Inpatient level of care appropriate due to severity of illness  Dispo: The patient is from: Home              Anticipated d/c is to: Home              Patient currently is not medically stable to d/c.   Difficult to place patient No  Consultants:  GI  Procedures:  None  Antimicrobials:  None   Subjective: Patient seen and evaluated today with no new acute complaints or concerns. No acute concerns or  events noted overnight.  She denies any abdominal pain, or further hematemesis since admission.  Objective: Vitals:   01/11/21 0220 01/11/21 0224 01/11/21 0357 01/11/21 0600  BP: (!) 105/48 (!) 105/48 (!) 151/126 109/64  Pulse: 93 93 100   Resp: 15 15 15    Temp: 98.3 F (36.8 C) 98.3 F (36.8 C) 99 F (37.2 C)   TempSrc: Oral Oral Oral   SpO2: 97% 97% 97%   Weight:      Height:        Intake/Output Summary (Last 24 hours) at 01/11/2021 1119 Last data filed at 01/11/2021 0920 Gross per 24 hour  Intake 3969.02 ml  Output --  Net 3969.02 ml   Filed Weights   01/10/21 1233 01/10/21 1645  Weight: 68 kg 74.2 kg    Examination:  General exam: Appears calm and comfortable  Respiratory system: Clear to auscultation. Respiratory effort normal. Cardiovascular system: S1 & S2 heard, RRR.  Gastrointestinal system: Abdomen is soft Central nervous system: Alert and awake Extremities: No edema Skin: No significant lesions noted Psychiatry: Flat affect.    Data Reviewed: I have personally reviewed following labs and imaging studies  CBC: Recent Labs  Lab 01/10/21 1300 01/10/21 2018 01/11/21 0530  WBC 14.1* 11.1* 7.3  NEUTROABS 10.8*  --   --   HGB 9.8* 7.5* 8.9*  HCT 29.5* 22.8* 26.6*  MCV 87.5 89.8 87.5  PLT 236 140* 125*   Basic Metabolic Panel: Recent Labs  Lab 01/10/21 1300  NA 136  K 3.7  CL 102  CO2 28  GLUCOSE 147*  BUN 24*  CREATININE 0.68  CALCIUM 8.3*   GFR: Estimated Creatinine Clearance: 82.1 mL/min (by C-G formula based on SCr of 0.68 mg/dL). Liver Function Tests: Recent Labs  Lab 01/10/21 1300  AST 53*  ALT 42  ALKPHOS 80  BILITOT 1.1  PROT 6.6  ALBUMIN 2.6*   Recent Labs  Lab 01/10/21 1300  LIPASE 18   No results for input(s): AMMONIA in the last 168 hours. Coagulation Profile: Recent Labs  Lab 01/10/21 1300  INR 1.2   Cardiac Enzymes: No results for input(s): CKTOTAL, CKMB, CKMBINDEX, TROPONINI in the last 168 hours. BNP  (last 3 results) No results for input(s): PROBNP in the last 8760 hours. HbA1C: No results for input(s): HGBA1C in the last 72 hours. CBG: No results for input(s): GLUCAP in the last 168 hours. Lipid Profile: No results for input(s): CHOL, HDL, LDLCALC, TRIG, CHOLHDL, LDLDIRECT in the last 72 hours. Thyroid Function Tests: No results for input(s): TSH, T4TOTAL, FREET4, T3FREE, THYROIDAB in the last 72 hours. Anemia Panel: No results for input(s): VITAMINB12, FOLATE, FERRITIN, TIBC, IRON, RETICCTPCT in the last 72 hours. Sepsis Labs: No results for input(s): PROCALCITON, LATICACIDVEN in the last 168 hours.  Recent Results (from the past 240 hour(s))  Resp Panel by RT-PCR (Flu A&B, Covid) Nasopharyngeal Swab     Status: None   Collection Time: 01/10/21  1:31 PM   Specimen: Nasopharyngeal Swab; Nasopharyngeal(NP) swabs in vial transport medium  Result Value Ref Range Status   SARS Coronavirus 2 by RT PCR NEGATIVE NEGATIVE Final    Comment: (NOTE) SARS-CoV-2 target nucleic acids are NOT DETECTED.  The SARS-CoV-2 RNA is generally detectable in upper respiratory specimens during the acute phase of infection. The lowest concentration of SARS-CoV-2 viral copies this assay can detect is 138 copies/mL. A negative result does not preclude SARS-Cov-2 infection and should not be used as the sole basis for treatment or other patient management decisions. A negative result may occur with  improper specimen collection/handling, submission of specimen other than nasopharyngeal swab, presence of viral mutation(s) within the areas targeted by this assay, and inadequate number of viral copies(<138 copies/mL). A negative result must be combined with clinical observations, patient history, and epidemiological information. The expected result is Negative.  Fact Sheet for Patients:  BloggerCourse.com  Fact Sheet for Healthcare Providers:   SeriousBroker.it  This test is no t yet approved or cleared by the [place] FDA and  has been authorized for detection and/or diagnosis of SARS-CoV-2 by FDA under an Emergency Use Authorization (EUA). This EUA will remain  in effect (meaning this test can be used) for the duration of the COVID-19 declaration under Section 564(b)(1) of the Act, 21 U.S.C.section 360bbb-3(b)(1), unless the authorization is terminated  or revoked sooner.       Influenza A by PCR NEGATIVE NEGATIVE Final   Influenza B by PCR NEGATIVE NEGATIVE Final    Comment: (NOTE) The Xpert Xpress SARS-CoV-2/FLU/RSV plus assay is intended as an aid in the diagnosis of influenza from Nasopharyngeal swab specimens and should not be used as a sole basis for treatment. Nasal washings and aspirates are unacceptable for Xpert Xpress SARS-CoV-2/FLU/RSV testing.  Fact Sheet for Patients: BloggerCourse.com  Fact Sheet for Healthcare Providers: SeriousBroker.it  This test is not yet approved or cleared by the [place] FDA and has been authorized for detection and/or diagnosis of SARS-CoV-2 by FDA  under an Emergency Use Authorization (EUA). This EUA will remain in effect (meaning this test can be used) for the duration of the COVID-19 declaration under Section 564(b)(1) of the Act, 21 U.S.C. section 360bbb-3(b)(1), unless the authorization is terminated or revoked.  Performed at [facility], [address] [place], [place] [address]          Radiology Studies: CT Abdomen Pelvis W Contrast  Result Date: 01/10/2021 CLINICAL DATA:  Acute epigastric pain.  Assess for gastric ulcer. EXAM: CT ABDOMEN AND PELVIS WITH CONTRAST TECHNIQUE: Multidetector CT imaging of the abdomen and pelvis was performed using the standard protocol following bolus administration of intravenous contrast. CONTRAST:  OMNIPAQUE IOHEXOL 300 MG/ML  SOLN  COMPARISON:  October 25, 2011 FINDINGS: Lower chest: No acute abnormality. Hepatobiliary: No focal liver abnormality is seen. Status post cholecystectomy. No biliary dilatation. Pancreas: Unremarkable. No pancreatic ductal dilatation or surrounding inflammatory changes. Spleen: Normal in size without focal abnormality. Adrenals/Urinary Tract: Adrenal glands are unremarkable. Kidneys are normal, without renal calculi, focal lesion, or hydronephrosis. Bladder is unremarkable. Stomach/Bowel: Moderate to large hiatal hernia is identified unchanged compared prior exam. The stomach is otherwise unremarkable. There is no small bowel obstruction. Colon is normal. Moderate bowel content is identified throughout colon. The appendix is not seen but no inflammation is noted around cecum. Vascular/Lymphatic: Aortic atherosclerosis. No enlarged abdominal or pelvic lymph nodes. Reproductive: Uterus and bilateral adnexa are unremarkable. Other: None. Musculoskeletal: No acute abnormality. IMPRESSION: 1. No acute abnormality identified in the abdomen and pelvis. 2. Moderate to large hiatal hernia unchanged compared prior exam. 3. Moderate bowel content is identified throughout colon. This can be seen in constipation. Aortic Atherosclerosis (ICD10-I70.0). Electronically Signed   By: Sherian Rein M.D.   On: 01/10/2021 14:46        Scheduled Meds:  influenza vac split quadrivalent PF  0.5 mL Intramuscular Tomorrow-1000   Continuous Infusions:  sodium chloride     lactated ringers 100 mL/hr at 01/11/21 0920   pantoprazole 8 mg/hr (01/11/21 0920)     LOS: 0 days    Time spent: 35 minutes    Celestino Ackerman Hoover Brunette, DO [facility]  If 7PM-7AM, please contact night-coverage [email or web address] 01/11/2021, 11:19 AM

## 2021-01-11 NOTE — Discharge Summary (Signed)
Physician Discharge Summary  Kathleen Mcpherson ZOX:[id number] DOB: September 11, 1970 DOA: 01/10/2021  PCP: Evelene Croon, MD  Admit date: 01/10/2021  Discharge date: 01/11/2021  Admitted From:Home  Disposition:  Home  Recommendations for Outpatient Follow-up:  Follow up with PCP in 1-2 weeks Please obtain BMP/CBC in one week Follow-up with [facility] gastroenterology in 8-12 weeks for repeat EGD Continue PPI twice daily as ordered Continue Carafate 4 times daily as ordered for 30 days  Home Health: None  Equipment/Devices: None  Discharge Condition:Stable  CODE STATUS: Full  Diet recommendation: Heart Healthy  Brief/Interim Summary: Kathleen Mcpherson is a 50 y.o. female with a history of Addison's disease (not currently on medications), COPD, fibromyalgia, GERD, history of hepatitis C.  Patient seen for 3 episodes of hematemesis that started earlier today.  She has undergone EGD on 10/10 with findings of LA grade D esophagitis with no active bleeding.  She is also noted to have hiatal hernia.  She has received 2 units of PRBC with stable hemoglobin levels noted and no active bleeding noted.  Per GI, she is stable for discharge on PPI twice daily as well as Carafate as ordered above.  She will have close follow-up arranged by [facility] gastroenterology in the next 8-12 weeks for EGD follow-up.  No other acute events noted this admission.  Discharge Diagnoses:  Principal Problem:   Upper GI bleed Active Problems:   MITRAL REGURGITATION   COPD (chronic obstructive pulmonary disease) (HCC)   Fibromyalgia   Addison disease (HCC)   Hepatitis C   GERD (gastroesophageal reflux disease)   Elevated LFTs   Acute blood loss anemia  Principal discharge diagnosis: Acute blood loss anemia status post 2 unit PRBC transfusion secondary to hematemesis in the setting of LA grade D esophagitis.  Discharge Instructions  Discharge Instructions     Diet - low sodium heart healthy   Complete by:  As directed    Increase activity slowly   Complete by: As directed       Allergies as of 01/11/2021       Reactions   Asa [aspirin] Other (See Comments)   G.I.Upset   Prednisone Itching   Tape    Tylenol [acetaminophen] Other (See Comments)   G.I. Upset        Medication List     STOP taking these medications    clindamycin 150 MG capsule Commonly known as: CLEOCIN   ibuprofen 400 MG tablet Commonly known as: ADVIL   levofloxacin 250 MG tablet Commonly known as: LEVAQUIN   oxyCODONE 5 MG immediate release tablet Commonly known as: Roxicodone   saccharomyces boulardii 250 MG capsule Commonly known as: FLORASTOR       TAKE these medications    albuterol (2.5 MG/3ML) 0.083% nebulizer solution Commonly known as: PROVENTIL Take 2.5 mg by nebulization every 6 (six) hours as needed for wheezing or shortness of breath.   busPIRone 10 MG tablet Commonly known as: BUSPAR Take 10 mg by mouth 3 (three) times daily.   clonazePAM 0.5 MG tablet Commonly known as: KLONOPIN Take 0.5 mg by mouth 2 (two) times daily.   pantoprazole 40 MG tablet Commonly known as: PROTONIX Take 1 tablet (40 mg total) by mouth 2 (two) times daily. What changed: when to take this   sucralfate 1 g tablet Commonly known as: Carafate Take 1 tablet (1 g total) by mouth 4 (four) times daily.        Follow-up Information     Evelene Croon, MD. Schedule  an appointment as soon as possible for a visit.   Specialty: Family Medicine Contact information: [facility] [place] [address] [phone]         [facility]. Go to.   Contact information: [address] [place] [address] [phone]               Allergies  Allergen Reactions   Jonne Ply [Aspirin] Other (See Comments)    G.I.Upset   Prednisone Itching   Tape    Tylenol [Acetaminophen] Other (See Comments)    G.I. Upset     Consultations: Gastroenterology   Procedures/Studies: CT Abdomen Pelvis W Contrast  Result Date: 01/10/2021 CLINICAL DATA:  Acute epigastric pain.  Assess for gastric ulcer. EXAM: CT ABDOMEN AND PELVIS WITH CONTRAST TECHNIQUE: Multidetector CT imaging of the abdomen and pelvis was performed using the standard protocol following bolus administration of intravenous contrast. CONTRAST:  OMNIPAQUE IOHEXOL 300 MG/ML  SOLN COMPARISON:  October 25, 2011 FINDINGS: Lower chest: No acute abnormality. Hepatobiliary: No focal liver abnormality is seen. Status post cholecystectomy. No biliary dilatation. Pancreas: Unremarkable. No pancreatic ductal dilatation or surrounding inflammatory changes. Spleen: Normal in size without focal abnormality. Adrenals/Urinary Tract: Adrenal glands are unremarkable. Kidneys are normal, without renal calculi, focal lesion, or hydronephrosis. Bladder is unremarkable. Stomach/Bowel: Moderate to large hiatal hernia is identified unchanged compared prior exam. The stomach is otherwise unremarkable. There is no small bowel obstruction. Colon is normal. Moderate bowel content is identified throughout colon. The appendix is not seen but no inflammation is noted around cecum. Vascular/Lymphatic: Aortic atherosclerosis. No enlarged abdominal or pelvic lymph nodes. Reproductive: Uterus and bilateral adnexa are unremarkable. Other: None. Musculoskeletal: No acute abnormality. IMPRESSION: 1. No acute abnormality identified in the abdomen and pelvis. 2. Moderate to large hiatal hernia unchanged compared prior exam. 3. Moderate bowel content is identified throughout colon. This can be seen in constipation. Aortic Atherosclerosis (ICD10-I70.0). Electronically Signed   By: Sherian Rein M.D.   On: 01/10/2021 14:46     Discharge Exam: Vitals:   01/11/21 0600 01/11/21 1136  BP: 109/64 112/72  Pulse:    Resp:  16  Temp:  98.3 F (36.8 C)  SpO2:  95%   Vitals:   01/11/21 0224 01/11/21  0357 01/11/21 0600 01/11/21 1136  BP: (!) 105/48 (!) 151/126 109/64 112/72  Pulse: 93 100    Resp: 15 15  16   Temp: 98.3 F (36.8 C) 99 F (37.2 C)  98.3 F (36.8 C)  TempSrc: Oral Oral  Oral  SpO2: 97% 97%  95%  Weight:      Height:        General: Pt is alert, awake, not in acute distress Cardiovascular: RRR, S1/S2 +, no rubs, no gallops Respiratory: CTA bilaterally, no wheezing, no rhonchi Abdominal: Soft, NT, ND, bowel sounds + Extremities: no edema, no cyanosis    The results of significant diagnostics from this hospitalization (including imaging, microbiology, ancillary and laboratory) are listed below for reference.     Microbiology: Recent Results (from the past 240 hour(s))  Resp Panel by RT-PCR (Flu A&B, Covid) Nasopharyngeal Swab     Status: None   Collection Time: 01/10/21  1:31 PM   Specimen: Nasopharyngeal Swab; Nasopharyngeal(NP) swabs in vial transport medium  Result Value Ref Range Status   SARS Coronavirus 2 by RT PCR NEGATIVE NEGATIVE Final    Comment: (NOTE) SARS-CoV-2 target nucleic acids are NOT DETECTED.  The SARS-CoV-2 RNA is generally detectable in upper respiratory specimens  during the acute phase of infection. The lowest concentration of SARS-CoV-2 viral copies this assay can detect is 138 copies/mL. A negative result does not preclude SARS-Cov-2 infection and should not be used as the sole basis for treatment or other patient management decisions. A negative result may occur with  improper specimen collection/handling, submission of specimen other than nasopharyngeal swab, presence of viral mutation(s) within the areas targeted by this assay, and inadequate number of viral copies(<138 copies/mL). A negative result must be combined with clinical observations, patient history, and epidemiological information. The expected result is Negative.  Fact Sheet for Patients:  BloggerCourse.com  Fact Sheet for Healthcare  Providers:  SeriousBroker.it  This test is no t yet approved or cleared by the [place] FDA and  has been authorized for detection and/or diagnosis of SARS-CoV-2 by FDA under an Emergency Use Authorization (EUA). This EUA will remain  in effect (meaning this test can be used) for the duration of the COVID-19 declaration under Section 564(b)(1) of the Act, 21 U.S.C.section 360bbb-3(b)(1), unless the authorization is terminated  or revoked sooner.       Influenza A by PCR NEGATIVE NEGATIVE Final   Influenza B by PCR NEGATIVE NEGATIVE Final    Comment: (NOTE) The Xpert Xpress SARS-CoV-2/FLU/RSV plus assay is intended as an aid in the diagnosis of influenza from Nasopharyngeal swab specimens and should not be used as a sole basis for treatment. Nasal washings and aspirates are unacceptable for Xpert Xpress SARS-CoV-2/FLU/RSV testing.  Fact Sheet for Patients: BloggerCourse.com  Fact Sheet for Healthcare Providers: SeriousBroker.it  This test is not yet approved or cleared by the [place] FDA and has been authorized for detection and/or diagnosis of SARS-CoV-2 by FDA under an Emergency Use Authorization (EUA). This EUA will remain in effect (meaning this test can be used) for the duration of the COVID-19 declaration under Section 564(b)(1) of the Act, 21 U.S.C. section 360bbb-3(b)(1), unless the authorization is terminated or revoked.  Performed at [facility], [address] [place], [place] [address]      Labs: BNP (last 3 results) No results for input(s): BNP in the last 8760 hours. Basic Metabolic Panel: Recent Labs  Lab 01/10/21 1300  NA 136  K 3.7  CL 102  CO2 28  GLUCOSE 147*  BUN 24*  CREATININE 0.68  CALCIUM 8.3*   Liver Function Tests: Recent Labs  Lab 01/10/21 1300  AST 53*  ALT 42  ALKPHOS 80  BILITOT 1.1  PROT 6.6  ALBUMIN 2.6*   Recent Labs  Lab  01/10/21 1300  LIPASE 18   No results for input(s): AMMONIA in the last 168 hours. CBC: Recent Labs  Lab 01/10/21 1300 01/10/21 2018 01/11/21 0530  WBC 14.1* 11.1* 7.3  NEUTROABS 10.8*  --   --   HGB 9.8* 7.5* 8.9*  HCT 29.5* 22.8* 26.6*  MCV 87.5 89.8 87.5  PLT 236 140* 125*   Cardiac Enzymes: No results for input(s): CKTOTAL, CKMB, CKMBINDEX, TROPONINI in the last 168 hours. BNP: Invalid input(s): POCBNP CBG: No results for input(s): GLUCAP in the last 168 hours. D-Dimer No results for input(s): DDIMER in the last 72 hours. Hgb A1c No results for input(s): HGBA1C in the last 72 hours. Lipid Profile No results for input(s): CHOL, HDL, LDLCALC, TRIG, CHOLHDL, LDLDIRECT in the last 72 hours. Thyroid function studies No results for input(s): TSH, T4TOTAL, T3FREE, THYROIDAB in the last 72 hours.  Invalid input(s): FREET3 Anemia work up No results for input(s): VITAMINB12, FOLATE,  FERRITIN, TIBC, IRON, RETICCTPCT in the last 72 hours. Urinalysis    Component Value Date/Time   COLORURINE AMBER (A) 10/25/2011 2109   APPEARANCEUR CLEAR 10/25/2011 2109   LABSPEC >1.030 (H) 10/25/2011 2109   PHURINE 5.5 10/25/2011 2109   GLUCOSEU 100 (A) 10/25/2011 2109   HGBUR NEGATIVE 10/25/2011 2109   BILIRUBINUR MODERATE (A) 10/25/2011 2109   KETONESUR NEGATIVE 10/25/2011 2109   PROTEINUR NEGATIVE 10/25/2011 2109   UROBILINOGEN 1.0 10/25/2011 2109   NITRITE NEGATIVE 10/25/2011 2109   LEUKOCYTESUR NEGATIVE 10/25/2011 2109   Sepsis Labs Invalid input(s): PROCALCITONIN,  WBC,  LACTICIDVEN Microbiology Recent Results (from the past 240 hour(s))  Resp Panel by RT-PCR (Flu A&B, Covid) Nasopharyngeal Swab     Status: None   Collection Time: 01/10/21  1:31 PM   Specimen: Nasopharyngeal Swab; Nasopharyngeal(NP) swabs in vial transport medium  Result Value Ref Range Status   SARS Coronavirus 2 by RT PCR NEGATIVE NEGATIVE Final    Comment: (NOTE) SARS-CoV-2 target nucleic acids are NOT  DETECTED.  The SARS-CoV-2 RNA is generally detectable in upper respiratory specimens during the acute phase of infection. The lowest concentration of SARS-CoV-2 viral copies this assay can detect is 138 copies/mL. A negative result does not preclude SARS-Cov-2 infection and should not be used as the sole basis for treatment or other patient management decisions. A negative result may occur with  improper specimen collection/handling, submission of specimen other than nasopharyngeal swab, presence of viral mutation(s) within the areas targeted by this assay, and inadequate number of viral copies(<138 copies/mL). A negative result must be combined with clinical observations, patient history, and epidemiological information. The expected result is Negative.  Fact Sheet for Patients:  BloggerCourse.com  Fact Sheet for Healthcare Providers:  SeriousBroker.it  This test is no t yet approved or cleared by the [place] FDA and  has been authorized for detection and/or diagnosis of SARS-CoV-2 by FDA under an Emergency Use Authorization (EUA). This EUA will remain  in effect (meaning this test can be used) for the duration of the COVID-19 declaration under Section 564(b)(1) of the Act, 21 U.S.C.section 360bbb-3(b)(1), unless the authorization is terminated  or revoked sooner.       Influenza A by PCR NEGATIVE NEGATIVE Final   Influenza B by PCR NEGATIVE NEGATIVE Final    Comment: (NOTE) The Xpert Xpress SARS-CoV-2/FLU/RSV plus assay is intended as an aid in the diagnosis of influenza from Nasopharyngeal swab specimens and should not be used as a sole basis for treatment. Nasal washings and aspirates are unacceptable for Xpert Xpress SARS-CoV-2/FLU/RSV testing.  Fact Sheet for Patients: BloggerCourse.com  Fact Sheet for Healthcare Providers: SeriousBroker.it  This test is not yet  approved or cleared by the [place] FDA and has been authorized for detection and/or diagnosis of SARS-CoV-2 by FDA under an Emergency Use Authorization (EUA). This EUA will remain in effect (meaning this test can be used) for the duration of the COVID-19 declaration under Section 564(b)(1) of the Act, 21 U.S.C. section 360bbb-3(b)(1), unless the authorization is terminated or revoked.  Performed at [facility], [address] [place], [place] [address]      Time coordinating discharge: 35 minutes  SIGNED:   Erick Blinks, DO [facility] 01/11/2021, 12:21 PM  If 7PM-7AM, please contact night-coverage [email or web address]

## 2021-01-11 NOTE — Telephone Encounter (Signed)
Hi, Mitzi and Ann!  Patient to be discharged today. Patient of Dr. Karilyn Cota. She needs EGD in 8-12 weeks to re-evaluate severe esophagitis and biopsy for Barrett's. Needs outpatient follow-up in the clinic, too.   Dena: can you have her repeat a CBC next Monday? Reason: acute blood loss anemia.

## 2021-01-11 NOTE — H&P (View-Only) (Signed)
Referring Provider: Dr. Sherryll Burger Primary Care Physician:  Evelene Croon, MD Primary Gastroenterologist:  Dr. Karilyn Cota  Date of Admission: 01/10/21 Date of Consultation: 01/11/21  Reason for Consultation:  Hematemesis   HPI:  Kathleen Mcpherson is a 50 y.o. year old female with history of Hep C, genotype 2b, unclear if prior treatment, COPD, Addison's disease, GERD, presenting yesterday with acute onset of hematemesis to the ED. Hgb 9.8 on admission and down to 7.5, receiving 2 units PRBCs since admission. Hgb this morning 8.9. CT abd/pelvis with contrast yesterday with moderate to large hiatal hernia. Liver and spleen normal. GI consulted for hematemesis.   Patient reports chronic GERD, "bubbling" sensations in chest chronically, noting acute onset of hematemesis yesterday. No significant epigastric pain noted. Takes Protonix for GERD. Has to take Tums as well. Intermittent ibuprofen use. Unable to quantify. Notes regurgitation when bending over. Solid food dysphagia as well. Globus sensation. Drowsy today and limited historian but oriented X 4. Last EGD in Aug 2013: erosive/ulcerative reflux esophagitis, bulbar duodenitis.    Last colonoscopy many years ago. Unable to find records. Possible history of colon polyps. Appears that she was evaluated for Hep C in 2013 but did not follow-up for treatment.   Past Medical History:  Diagnosis Date   Addison disease (HCC)    COPD (chronic obstructive pulmonary disease) (HCC)    Fibromyalgia    Hepatitis C     Past Surgical History:  Procedure Laterality Date   CHOLECYSTECTOMY     ESOPHAGOGASTRODUODENOSCOPY  11/23/2011   erosive/ulcerative reflux esophagitis, bulbar duodenitis.   INCISION AND DRAINAGE ABSCESS Left 05/31/2015   Procedure: INCISION AND DRAINAGE ABSCESS left foot;  Surgeon: Vickki Hearing, MD;  Location: [facility];  Service: Orthopedics;  Laterality: Left;   ORTHOPEDIC SURGERY     knee surgery    Prior to Admission medications    Medication Sig Start Date End Date Taking? Authorizing Provider  busPIRone (BUSPAR) 10 MG tablet Take 10 mg by mouth 3 (three) times daily. 03/03/15  Yes [provider]  clonazePAM (KLONOPIN) 0.5 MG tablet Take 0.5 mg by mouth 2 (two) times daily. 05/22/15  Yes [provider]  pantoprazole (PROTONIX) 40 MG tablet Take 1 tablet (40 mg total) by mouth daily. 06/01/15  Yes Vassie Loll, MD  albuterol (PROVENTIL) (2.5 MG/3ML) 0.083% nebulizer solution Take 2.5 mg by nebulization every 6 (six) hours as needed for wheezing or shortness of breath. 12/14/20   [provider]  clindamycin (CLEOCIN) 150 MG capsule Take 1 capsule (150 mg total) by mouth 3 (three) times daily. Patient not taking: No sig reported 06/12/15   Vickki Hearing, MD  ibuprofen (ADVIL,MOTRIN) 400 MG tablet Take 1 tablet (400 mg total) by mouth every 8 (eight) hours as needed for moderate pain. Patient not taking: Reported on 01/11/2021 06/01/15   Vassie Loll, MD  levofloxacin (LEVAQUIN) 250 MG tablet Take 1 tablet (250 mg total) by mouth daily. Patient not taking: No sig reported 06/18/15   Vickki Hearing, MD  oxyCODONE (ROXICODONE) 5 MG immediate release tablet Take 1-2 tablets (5-10 mg total) by mouth every 6 (six) hours as needed for severe pain. Patient not taking: No sig reported 06/12/15   Vickki Hearing, MD  saccharomyces boulardii (FLORASTOR) 250 MG capsule Take 1 capsule (250 mg total) by mouth 2 (two) times daily. Patient not taking: No sig reported 06/01/15   Vassie Loll, MD    Current Facility-Administered Medications  Medication Dose Route Frequency Provider Last  Rate Last Admin   influenza vac split quadrivalent PF (FLUARIX) injection 0.5 mL  0.5 mL Intramuscular Tomorrow-1000 Stinson, Jacob J, DO       lactated ringers infusion   Intravenous Continuous Levie Heritage, DO 100 mL/hr at 01/11/21 0920 Infusion Verify at 01/11/21 0920   ondansetron (ZOFRAN) tablet 4 mg  4 mg  Oral Q6H PRN Levie Heritage, DO       Or   ondansetron [facility]) injection 4 mg  4 mg Intravenous Q6H PRN Levie Heritage, DO       pantoprozole (PROTONIX) 80 mg /NS 100 mL infusion  8 mg/hr Intravenous Continuous Levie Heritage, DO 10 mL/hr at 01/11/21 0920 8 mg/hr at 01/11/21 0920    Allergies as of 01/10/2021 - Review Complete 01/10/2021  Allergen Reaction Noted   Asa [aspirin] Other (See Comments) 11/21/2011   Prednisone Itching 05/30/2015   Tylenol [acetaminophen] Other (See Comments) 11/21/2011    Family History  Problem Relation Age of Onset   Colon cancer Neg Hx     Social History   Socioeconomic History   Marital status: Married    Spouse name: Not on file   Number of children: Not on file   Years of education: Not on file   Highest education level: Not on file  Occupational History   Not on file  Tobacco Use   Smoking status: Every Day    Packs/day: 0.50    Types: Cigarettes   Smokeless tobacco: Never   Tobacco comments:    1 pack a day.  Vaping Use   Vaping Use: Some days  Substance and Sexual Activity   Alcohol use: No   Drug use: No    Comment: In the past, she smoke cocaine and marijuana. No IV drugs.   Sexual activity: Not on file  Other Topics Concern   Not on file  Social History Narrative   Not on file   Social Determinants of Health   Financial Resource Strain: Not on file  Food Insecurity: Not on file  Transportation Needs: Not on file  Physical Activity: Not on file  Stress: Not on file  Social Connections: Not on file  Intimate Partner Violence: Not on file    Review of Systems: Gen: Denies fever, chills, loss of appetite, change in weight or weight loss CV: Denies chest pain, heart palpitations, syncope, edema  Resp: Denies shortness of breath with rest, cough, wheezing GI: see HPI GU : Denies urinary burning, urinary frequency, urinary incontinence.  MS: Denies joint pain,swelling, cramping Derm: Denies rash, itching, dry  skin Psych: Denies depression, anxiety,confusion, or memory loss Heme: see HPI  Physical Exam: Vital signs in last 24 hours: Temp:  [97.8 F (36.6 C)-99 F (37.2 C)] 99 F (37.2 C) (10/10 0357) Pulse Rate:  [89-125] 100 (10/10 0357) Resp:  [12-20] 15 (10/10 0357) BP: (87-151)/(48-126) 109/64 (10/10 0600) SpO2:  [94 %-100 %] 97 % (10/10 0357) Weight:  [68 kg-74.2 kg] 74.2 kg (10/09 1645) Last BM Date: 01/10/21 General:   Drowsy but easily awakens. Appears older than stated age. Oriented X 4. No distress.  Head:  Normocephalic and atraumatic. Eyes:  Sclera clear, no icterus.   Ears:  Normal auditory acuity. Nose:  No deformity, discharge,  or lesions. Mouth:  No deformity or lesions, dentition normal. Lungs:  mild wheeze bilaterally  Heart:  S1 S2 present without murmurs Abdomen:  Soft, nontender and nondistended. No masses, hepatosplenomegaly or hernias noted. Normal bowel sounds, without guarding,  and without rebound.   Rectal:  Deferred  Msk:  Symmetrical without gross deformities. Normal posture. Extremities:  Without edema. Neurologic:  Oriented X 4 Skin:  Intact without significant lesions or rashes. Psych:  Alert and cooperative. Normal mood and affect.  Intake/Output from previous day: 10/09 0701 - 10/10 0700 In: 3099.1 [I.V.:1590.7; Blood:508.3; IV Piggyback:1000] Out: -  Intake/Output this shift: Total I/O In: 870 [I.V.:870] Out: -   Lab Results: Recent Labs    01/10/21 1300 01/10/21 2018 01/11/21 0530  WBC 14.1* 11.1* 7.3  HGB 9.8* 7.5* 8.9*  HCT 29.5* 22.8* 26.6*  PLT 236 140* 125*   BMET Recent Labs    01/10/21 1300  NA 136  K 3.7  CL 102  CO2 28  GLUCOSE 147*  BUN 24*  CREATININE 0.68  CALCIUM 8.3*   LFT Recent Labs    01/10/21 1300  PROT 6.6  ALBUMIN 2.6*  AST 53*  ALT 42  ALKPHOS 80  BILITOT 1.1   PT/INR Recent Labs    01/10/21 1300  LABPROT 15.3*  INR 1.2     Studies/Results: CT Abdomen Pelvis W Contrast  Result  Date: 01/10/2021 CLINICAL DATA:  Acute epigastric pain.  Assess for gastric ulcer. EXAM: CT ABDOMEN AND PELVIS WITH CONTRAST TECHNIQUE: Multidetector CT imaging of the abdomen and pelvis was performed using the standard protocol following bolus administration of intravenous contrast. CONTRAST:  OMNIPAQUE IOHEXOL 300 MG/ML  SOLN COMPARISON:  October 25, 2011 FINDINGS: Lower chest: No acute abnormality. Hepatobiliary: No focal liver abnormality is seen. Status post cholecystectomy. No biliary dilatation. Pancreas: Unremarkable. No pancreatic ductal dilatation or surrounding inflammatory changes. Spleen: Normal in size without focal abnormality. Adrenals/Urinary Tract: Adrenal glands are unremarkable. Kidneys are normal, without renal calculi, focal lesion, or hydronephrosis. Bladder is unremarkable. Stomach/Bowel: Moderate to large hiatal hernia is identified unchanged compared prior exam. The stomach is otherwise unremarkable. There is no small bowel obstruction. Colon is normal. Moderate bowel content is identified throughout colon. The appendix is not seen but no inflammation is noted around cecum. Vascular/Lymphatic: Aortic atherosclerosis. No enlarged abdominal or pelvic lymph nodes. Reproductive: Uterus and bilateral adnexa are unremarkable. Other: None. Musculoskeletal: No acute abnormality. IMPRESSION: 1. No acute abnormality identified in the abdomen and pelvis. 2. Moderate to large hiatal hernia unchanged compared prior exam. 3. Moderate bowel content is identified throughout colon. This can be seen in constipation. Aortic Atherosclerosis (ICD10-I70.0). Electronically Signed   By: Sherian Rein M.D.   On: 01/10/2021 14:46    Impression: 50 y.o. year old female with history of Hep C, genotype 2b, unclear if prior treatment, COPD, Addison's disease, GERD, presenting yesterday with acute onset of hematemesis, acute blood loss anemia with Hgb 9.8 on admission and down to 7.5, receiving 2 units PRBCs since  admission. Hgb this morning 8.9. CT abd/pelvis with contrast yesterday with moderate to large hiatal hernia. Liver and spleen normal.    In setting of notable hiatal hernia, likely dealing with Sheria Lang ulcers as culprit for hematemesis. Unable to exclude PUD, esophagitis, MW tear. Imaging reviewed and no signs of cirrhosis. Chronic GERD has remained an issue for patient despite PPI therapy. She does also endorse NSAIDs intermittently but unable to quantify.   Solid food dysphagia reported as well, along with globus sensation.  Will pursue EGD +/- dilation with Propofol today. I discussed risks and benefits with patient, who stated understanding. She remains NPO.  In regards to Hep C, does not appear she has been treated but was  evaluated in 2013. I am ordering Hep C RNA with genotype reflex. Treatment can be pursued as outpatient in an elective setting.     Plan: Remain NPO 2. Continue PPI infusion for now 3. EGD +/- dilation with Dr. Carver today. Risks and benefits discussed in detail. 4. Hep C RNA with genotype reflex ordered 5. Outpatient treatment for Hep C as appropriate   Kathleen Sherley W. Analyssa Downs, PhD, ANP-BC [facility]     LOS: 0 days    01/11/2021, 9:26 AM    

## 2021-01-11 NOTE — Op Note (Signed)
[facility] Patient Name: Kathleen Mcpherson Procedure Date: 01/11/2021 11:54 AM MRN: [id number] Date of Birth: 05/28/1970 Attending MD: Elon Alas. Abbey Chatters DO CSN: [id number] Age: 50 Admit Type: Inpatient Procedure:                Upper GI endoscopy Indications:              Acute post hemorrhagic anemia, Hematemesis Providers:                Elon Alas. Abbey Chatters, DO, Janeece Riggers, RN, Crystal                            Page, Nelma Rothman, Technician Referring MD:              Medicines:                See the Anesthesia note for documentation of the                            administered medications Complications:            No immediate complications. Estimated Blood Loss:     Estimated blood loss: none. Procedure:                Pre-Anesthesia Assessment:                           - The anesthesia plan was to use monitored                            anesthesia care (MAC).                           After obtaining informed consent, the endoscope was                            passed under direct vision. Throughout the                            procedure, the patient's blood pressure, pulse, and                            oxygen saturations were monitored continuously. The                            [id number]-[id number] ([id number]) scope was introduced through the                            mouth, and advanced to the second part of duodenum.                            The upper GI endoscopy was accomplished without                            difficulty. The patient tolerated the procedure                            well. Scope  In: 12:08:09 PM Scope Out: 12:12:32 PM Total Procedure Duration: 0 hours 4 minutes 23 seconds  Findings:      LA Grade D (one or more mucosal breaks involving at least 75% of       esophageal circumference) esophagitis with no bleeding was found 21 to       31 cm from the incisors.      A medium-sized hiatal hernia was present.      Localized mild inflammation  characterized by erythema was found in the       gastric body.      The duodenal bulb, first portion of the duodenum and second portion of       the duodenum were normal. Impression:               - LA Grade D erosive esophagitis with no bleeding.                           - Medium-sized hiatal hernia.                           - Gastritis.                           - Normal duodenal bulb, first portion of the                            duodenum and second portion of the duodenum.                           - No specimens collected. Moderate Sedation:      Per Anesthesia Care Recommendation:           - Return patient to hospital ward for ongoing care.                           - Soft diet.                           - Etiology of patient's hematemesis likely LA grade                            D esophagitis approximately 10 cm in length. She                            will need aggressive PPI therapy twice daily. We                            will add Carafate 1 g 4 times daily. She will need                            repeat EGD in 8 to 12 weeks to evaluate healing as                            well as biopsy for underlying Barrett's esophagus.  Okay to discharge home from GI standpoint as no                            active bleeding identified and blood counts stable.                            She will need repeat CBC in 1 week. We will arrange                            follow-up. We will discuss status of hepatitis C as                            well in GI clinic. Procedure Code(s):        --- Professional ---                           [phone], Esophagogastroduodenoscopy, flexible,                            transoral; diagnostic, including collection of                            specimen(s) by brushing or washing, when performed                            (separate procedure) Diagnosis Code(s):        --- Professional ---                            K20.80, Other esophagitis without bleeding                           K44.9, Diaphragmatic hernia without obstruction or                            gangrene                           K29.70, Gastritis, unspecified, without bleeding                           D62, Acute posthemorrhagic anemia                           K92.0, Hematemesis CPT copyright 2019 [employer]. All rights reserved. The codes documented in this report are preliminary and upon coder review may  be revised to meet current compliance requirements. Elon Alas. Abbey Chatters, DO Dawson Abbey Chatters, DO 01/11/2021 12:21:23 PM This report has been signed electronically. Number of Addenda: 0

## 2021-01-11 NOTE — Anesthesia Preprocedure Evaluation (Signed)
Anesthesia Evaluation  Patient identified by MRN, date of birth, ID band Patient awake    Reviewed: Allergy & Precautions, NPO status , Patient's Chart, lab work & pertinent test results  Airway Mallampati: II  TM Distance: >3 FB Neck ROM: Full    Dental  (+) Dental Advisory Given, Missing   Pulmonary COPD,  COPD inhaler, Current Smoker and Patient abstained from smoking.,    Pulmonary exam normal breath sounds clear to auscultation       Cardiovascular Exercise Tolerance: Good Normal cardiovascular exam Rhythm:Regular Rate:Normal     Neuro/Psych  Neuromuscular disease negative psych ROS   GI/Hepatic GERD  Medicated and Poorly Controlled,(+) Hepatitis -, CUpper GI bleeding   Endo/Other  negative endocrine ROS  Renal/GU negative Renal ROS     Musculoskeletal  (+) Fibromyalgia -, narcotic dependent  Abdominal   Peds  Hematology  (+) anemia ,   Anesthesia Other Findings   Reproductive/Obstetrics                             Anesthesia Physical Anesthesia Plan  ASA: 3  Anesthesia Plan: General   Post-op Pain Management:    Induction: Intravenous  PONV Risk Score and Plan: TIVA  Airway Management Planned: Nasal Cannula and Natural Airway  Additional Equipment:   Intra-op Plan:   Post-operative Plan: Possible Post-op intubation/ventilation  Informed Consent: I have reviewed the patients History and Physical, chart, labs and discussed the procedure including the risks, benefits and alternatives for the proposed anesthesia with the patient or authorized representative who has indicated his/her understanding and acceptance.     Dental advisory given  Plan Discussed with: CRNA and Surgeon  Anesthesia Plan Comments:         Anesthesia Quick Evaluation

## 2021-01-11 NOTE — Interval H&P Note (Signed)
History and Physical Interval Note:  01/11/2021 11:59 AM  Kathleen Mcpherson  has presented today for surgery, with the diagnosis of hematemesis, acute blood loss anemia, dysphagia.  The various methods of treatment have been discussed with the patient and family. After consideration of risks, benefits and other options for treatment, the patient has consented to  Procedure(s) with comments: ESOPHAGOGASTRODUODENOSCOPY (EGD) WITH PROPOFOL (N/A) - possible dilation as a surgical intervention.  The patient's history has been reviewed, patient examined, no change in status, stable for surgery.  I have reviewed the patient's chart and labs.  Questions were answered to the patient's satisfaction.     Lanelle Bal

## 2021-01-11 NOTE — Progress Notes (Signed)
ERROR

## 2021-01-11 NOTE — Consult Note (Signed)
Referring Provider: Dr. Sherryll Burger Primary Care Physician:  Evelene Croon, MD Primary Gastroenterologist:  Dr. Karilyn Cota  Date of Admission: 01/10/21 Date of Consultation: 01/11/21  Reason for Consultation:  Hematemesis   HPI:  Kathleen Mcpherson is a 50 y.o. year old female with history of Hep C, genotype 2b, unclear if prior treatment, COPD, Addison's disease, GERD, presenting yesterday with acute onset of hematemesis to the ED. Hgb 9.8 on admission and down to 7.5, receiving 2 units PRBCs since admission. Hgb this morning 8.9. CT abd/pelvis with contrast yesterday with moderate to large hiatal hernia. Liver and spleen normal. GI consulted for hematemesis.   Patient reports chronic GERD, "bubbling" sensations in chest chronically, noting acute onset of hematemesis yesterday. No significant epigastric pain noted. Takes Protonix for GERD. Has to take Tums as well. Intermittent ibuprofen use. Unable to quantify. Notes regurgitation when bending over. Solid food dysphagia as well. Globus sensation. Drowsy today and limited historian but oriented X 4. Last EGD in Aug 2013: erosive/ulcerative reflux esophagitis, bulbar duodenitis.    Last colonoscopy many years ago. Unable to find records. Possible history of colon polyps. Appears that she was evaluated for Hep C in 2013 but did not follow-up for treatment.   Past Medical History:  Diagnosis Date   Addison disease (HCC)    COPD (chronic obstructive pulmonary disease) (HCC)    Fibromyalgia    Hepatitis C     Past Surgical History:  Procedure Laterality Date   CHOLECYSTECTOMY     ESOPHAGOGASTRODUODENOSCOPY  11/23/2011   erosive/ulcerative reflux esophagitis, bulbar duodenitis.   INCISION AND DRAINAGE ABSCESS Left 05/31/2015   Procedure: INCISION AND DRAINAGE ABSCESS left foot;  Surgeon: Vickki Hearing, MD;  Location: [facility];  Service: Orthopedics;  Laterality: Left;   ORTHOPEDIC SURGERY     knee surgery    Prior to Admission medications    Medication Sig Start Date End Date Taking? Authorizing Provider  busPIRone (BUSPAR) 10 MG tablet Take 10 mg by mouth 3 (three) times daily. 03/03/15  Yes [provider]  clonazePAM (KLONOPIN) 0.5 MG tablet Take 0.5 mg by mouth 2 (two) times daily. 05/22/15  Yes [provider]  pantoprazole (PROTONIX) 40 MG tablet Take 1 tablet (40 mg total) by mouth daily. 06/01/15  Yes Vassie Loll, MD  albuterol (PROVENTIL) (2.5 MG/3ML) 0.083% nebulizer solution Take 2.5 mg by nebulization every 6 (six) hours as needed for wheezing or shortness of breath. 12/14/20   [provider]  clindamycin (CLEOCIN) 150 MG capsule Take 1 capsule (150 mg total) by mouth 3 (three) times daily. Patient not taking: No sig reported 06/12/15   Vickki Hearing, MD  ibuprofen (ADVIL,MOTRIN) 400 MG tablet Take 1 tablet (400 mg total) by mouth every 8 (eight) hours as needed for moderate pain. Patient not taking: Reported on 01/11/2021 06/01/15   Vassie Loll, MD  levofloxacin (LEVAQUIN) 250 MG tablet Take 1 tablet (250 mg total) by mouth daily. Patient not taking: No sig reported 06/18/15   Vickki Hearing, MD  oxyCODONE (ROXICODONE) 5 MG immediate release tablet Take 1-2 tablets (5-10 mg total) by mouth every 6 (six) hours as needed for severe pain. Patient not taking: No sig reported 06/12/15   Vickki Hearing, MD  saccharomyces boulardii (FLORASTOR) 250 MG capsule Take 1 capsule (250 mg total) by mouth 2 (two) times daily. Patient not taking: No sig reported 06/01/15   Vassie Loll, MD    Current Facility-Administered Medications  Medication Dose Route Frequency Provider Last  Rate Last Admin   influenza vac split quadrivalent PF (FLUARIX) injection 0.5 mL  0.5 mL Intramuscular Tomorrow-1000 Stinson, Jacob J, DO       lactated ringers infusion   Intravenous Continuous Levie Heritage, DO 100 mL/hr at 01/11/21 0920 Infusion Verify at 01/11/21 0920   ondansetron (ZOFRAN) tablet 4 mg  4 mg  Oral Q6H PRN Levie Heritage, DO       Or   ondansetron [facility]) injection 4 mg  4 mg Intravenous Q6H PRN Levie Heritage, DO       pantoprozole (PROTONIX) 80 mg /NS 100 mL infusion  8 mg/hr Intravenous Continuous Levie Heritage, DO 10 mL/hr at 01/11/21 0920 8 mg/hr at 01/11/21 0920    Allergies as of 01/10/2021 - Review Complete 01/10/2021  Allergen Reaction Noted   Asa [aspirin] Other (See Comments) 11/21/2011   Prednisone Itching 05/30/2015   Tylenol [acetaminophen] Other (See Comments) 11/21/2011    Family History  Problem Relation Age of Onset   Colon cancer Neg Hx     Social History   Socioeconomic History   Marital status: Married    Spouse name: Not on file   Number of children: Not on file   Years of education: Not on file   Highest education level: Not on file  Occupational History   Not on file  Tobacco Use   Smoking status: Every Day    Packs/day: 0.50    Types: Cigarettes   Smokeless tobacco: Never   Tobacco comments:    1 pack a day.  Vaping Use   Vaping Use: Some days  Substance and Sexual Activity   Alcohol use: No   Drug use: No    Comment: In the past, she smoke cocaine and marijuana. No IV drugs.   Sexual activity: Not on file  Other Topics Concern   Not on file  Social History Narrative   Not on file   Social Determinants of Health   Financial Resource Strain: Not on file  Food Insecurity: Not on file  Transportation Needs: Not on file  Physical Activity: Not on file  Stress: Not on file  Social Connections: Not on file  Intimate Partner Violence: Not on file    Review of Systems: Gen: Denies fever, chills, loss of appetite, change in weight or weight loss CV: Denies chest pain, heart palpitations, syncope, edema  Resp: Denies shortness of breath with rest, cough, wheezing GI: see HPI GU : Denies urinary burning, urinary frequency, urinary incontinence.  MS: Denies joint pain,swelling, cramping Derm: Denies rash, itching, dry  skin Psych: Denies depression, anxiety,confusion, or memory loss Heme: see HPI  Physical Exam: Vital signs in last 24 hours: Temp:  [97.8 F (36.6 C)-99 F (37.2 C)] 99 F (37.2 C) (10/10 0357) Pulse Rate:  [89-125] 100 (10/10 0357) Resp:  [12-20] 15 (10/10 0357) BP: (87-151)/(48-126) 109/64 (10/10 0600) SpO2:  [94 %-100 %] 97 % (10/10 0357) Weight:  [68 kg-74.2 kg] 74.2 kg (10/09 1645) Last BM Date: 01/10/21 General:   Drowsy but easily awakens. Appears older than stated age. Oriented X 4. No distress.  Head:  Normocephalic and atraumatic. Eyes:  Sclera clear, no icterus.   Ears:  Normal auditory acuity. Nose:  No deformity, discharge,  or lesions. Mouth:  No deformity or lesions, dentition normal. Lungs:  mild wheeze bilaterally  Heart:  S1 S2 present without murmurs Abdomen:  Soft, nontender and nondistended. No masses, hepatosplenomegaly or hernias noted. Normal bowel sounds, without guarding,  and without rebound.   Rectal:  Deferred  Msk:  Symmetrical without gross deformities. Normal posture. Extremities:  Without edema. Neurologic:  Oriented X 4 Skin:  Intact without significant lesions or rashes. Psych:  Alert and cooperative. Normal mood and affect.  Intake/Output from previous day: 10/09 0701 - 10/10 0700 In: 3099.1 [I.V.:1590.7; Blood:508.3; IV Piggyback:1000] Out: -  Intake/Output this shift: Total I/O In: 870 [I.V.:870] Out: -   Lab Results: Recent Labs    01/10/21 1300 01/10/21 2018 01/11/21 0530  WBC 14.1* 11.1* 7.3  HGB 9.8* 7.5* 8.9*  HCT 29.5* 22.8* 26.6*  PLT 236 140* 125*   BMET Recent Labs    01/10/21 1300  NA 136  K 3.7  CL 102  CO2 28  GLUCOSE 147*  BUN 24*  CREATININE 0.68  CALCIUM 8.3*   LFT Recent Labs    01/10/21 1300  PROT 6.6  ALBUMIN 2.6*  AST 53*  ALT 42  ALKPHOS 80  BILITOT 1.1   PT/INR Recent Labs    01/10/21 1300  LABPROT 15.3*  INR 1.2     Studies/Results: CT Abdomen Pelvis W Contrast  Result  Date: 01/10/2021 CLINICAL DATA:  Acute epigastric pain.  Assess for gastric ulcer. EXAM: CT ABDOMEN AND PELVIS WITH CONTRAST TECHNIQUE: Multidetector CT imaging of the abdomen and pelvis was performed using the standard protocol following bolus administration of intravenous contrast. CONTRAST:  OMNIPAQUE IOHEXOL 300 MG/ML  SOLN COMPARISON:  October 25, 2011 FINDINGS: Lower chest: No acute abnormality. Hepatobiliary: No focal liver abnormality is seen. Status post cholecystectomy. No biliary dilatation. Pancreas: Unremarkable. No pancreatic ductal dilatation or surrounding inflammatory changes. Spleen: Normal in size without focal abnormality. Adrenals/Urinary Tract: Adrenal glands are unremarkable. Kidneys are normal, without renal calculi, focal lesion, or hydronephrosis. Bladder is unremarkable. Stomach/Bowel: Moderate to large hiatal hernia is identified unchanged compared prior exam. The stomach is otherwise unremarkable. There is no small bowel obstruction. Colon is normal. Moderate bowel content is identified throughout colon. The appendix is not seen but no inflammation is noted around cecum. Vascular/Lymphatic: Aortic atherosclerosis. No enlarged abdominal or pelvic lymph nodes. Reproductive: Uterus and bilateral adnexa are unremarkable. Other: None. Musculoskeletal: No acute abnormality. IMPRESSION: 1. No acute abnormality identified in the abdomen and pelvis. 2. Moderate to large hiatal hernia unchanged compared prior exam. 3. Moderate bowel content is identified throughout colon. This can be seen in constipation. Aortic Atherosclerosis (ICD10-I70.0). Electronically Signed   By: Sherian Rein M.D.   On: 01/10/2021 14:46    Impression: 50 y.o. year old female with history of Hep C, genotype 2b, unclear if prior treatment, COPD, Addison's disease, GERD, presenting yesterday with acute onset of hematemesis, acute blood loss anemia with Hgb 9.8 on admission and down to 7.5, receiving 2 units PRBCs since  admission. Hgb this morning 8.9. CT abd/pelvis with contrast yesterday with moderate to large hiatal hernia. Liver and spleen normal.    In setting of notable hiatal hernia, likely dealing with Sheria Lang ulcers as culprit for hematemesis. Unable to exclude PUD, esophagitis, MW tear. Imaging reviewed and no signs of cirrhosis. Chronic GERD has remained an issue for patient despite PPI therapy. She does also endorse NSAIDs intermittently but unable to quantify.   Solid food dysphagia reported as well, along with globus sensation.  Will pursue EGD +/- dilation with Propofol today. I discussed risks and benefits with patient, who stated understanding. She remains NPO.  In regards to Hep C, does not appear she has been treated but was  evaluated in 2013. I am ordering Hep C RNA with genotype reflex. Treatment can be pursued as outpatient in an elective setting.     Plan: Remain NPO 2. Continue PPI infusion for now 3. EGD +/- dilation with Dr. Marletta Lor today. Risks and benefits discussed in detail. 4. Hep C RNA with genotype reflex ordered 5. Outpatient treatment for Hep C as appropriate   Gelene Mink, PhD, ANP-BC [facility]     LOS: 0 days    01/11/2021, 9:26 AM

## 2021-01-11 NOTE — Telephone Encounter (Signed)
Labs  done and in the system for [facility]

## 2021-01-11 NOTE — Transfer of Care (Signed)
Immediate Anesthesia Transfer of Care Note  Patient: Kathleen Mcpherson  Procedure(s) Performed: ESOPHAGOGASTRODUODENOSCOPY (EGD) WITH PROPOFOL  Patient Location: Short Stay  Anesthesia Type:General  Level of Consciousness: awake, alert , oriented and patient cooperative  Airway & Oxygen Therapy: Patient Spontanous Breathing  Post-op Assessment: Report given to RN, Post -op Vital signs reviewed and stable and Patient moving all extremities  Post vital signs: Reviewed and stable  Last Vitals:  Vitals Value Taken Time  BP    Temp    Pulse    Resp    SpO2      Last Pain:  Vitals:   01/11/21 1204  TempSrc:   PainSc: 0-No pain      Patients Stated Pain Goal: 5 (01/11/21 1136)  Complications: No notable events documented.

## 2021-01-11 NOTE — Progress Notes (Signed)
Patient is starting on her second unit of blood. She appears to be more alert and stated that she was feeling better

## 2021-01-11 NOTE — Telephone Encounter (Signed)
Phoned the pt X 2 and her number is not in service and her MyChart is pending. FYI

## 2021-01-11 NOTE — Anesthesia Postprocedure Evaluation (Signed)
Anesthesia Post Note  Patient: Kathleen Mcpherson  Procedure(s) Performed: ESOPHAGOGASTRODUODENOSCOPY (EGD) WITH PROPOFOL  Patient location during evaluation: PACU Anesthesia Type: General Level of consciousness: awake and alert and oriented Pain management: pain level controlled Vital Signs Assessment: post-procedure vital signs reviewed and stable Respiratory status: spontaneous breathing and respiratory function stable Cardiovascular status: blood pressure returned to baseline and stable Postop Assessment: no apparent nausea or vomiting Anesthetic complications: no   No notable events documented.   Last Vitals:  Vitals:   01/11/21 1226 01/11/21 1230  BP: (!) 100/49 110/60  Pulse:  (!) 104  Resp:  18  Temp:    SpO2:  95%    Last Pain:  Vitals:   01/11/21 1215  TempSrc:   PainSc: 0-No pain                 Danyel Griess C Niyonna Betsill

## 2021-01-12 LAB — BPAM RBC
Blood Product Expiration Date: 202210302359
Blood Product Expiration Date: 202210302359
ISSUE DATE / TIME: 202210092241
ISSUE DATE / TIME: 202210100138
Unit Type and Rh: 6200
Unit Type and Rh: 6200

## 2021-01-12 LAB — TYPE AND SCREEN
ABO/RH(D): A POS
Antibody Screen: NEGATIVE
Unit division: 0
Unit division: 0

## 2021-01-13 ENCOUNTER — Encounter (HOSPITAL_COMMUNITY): Payer: Self-pay | Admitting: Internal Medicine

## 2021-01-13 LAB — HEPATITIS C GENOTYPE

## 2021-01-13 LAB — HCV RNA QUANT RFLX ULTRA OR GENOTYP
HCV RNA Qnt(log copy/mL): 6.455 log10 IU/mL
HepC Qn: 2850000 IU/mL

## 2021-02-02 ENCOUNTER — Ambulatory Visit (INDEPENDENT_AMBULATORY_CARE_PROVIDER_SITE_OTHER): Payer: Medicare Other | Admitting: Gastroenterology

## 2021-02-22 ENCOUNTER — Encounter (INDEPENDENT_AMBULATORY_CARE_PROVIDER_SITE_OTHER): Payer: Self-pay | Admitting: Gastroenterology

## 2021-02-22 ENCOUNTER — Ambulatory Visit (INDEPENDENT_AMBULATORY_CARE_PROVIDER_SITE_OTHER): Payer: Medicare Other | Admitting: Gastroenterology

## 2021-03-15 ENCOUNTER — Encounter (INDEPENDENT_AMBULATORY_CARE_PROVIDER_SITE_OTHER): Payer: Self-pay

## 2021-03-15 ENCOUNTER — Encounter (INDEPENDENT_AMBULATORY_CARE_PROVIDER_SITE_OTHER): Payer: Self-pay | Admitting: Gastroenterology

## 2021-03-15 ENCOUNTER — Other Ambulatory Visit (INDEPENDENT_AMBULATORY_CARE_PROVIDER_SITE_OTHER): Payer: Self-pay

## 2021-03-15 ENCOUNTER — Other Ambulatory Visit: Payer: Self-pay

## 2021-03-15 ENCOUNTER — Ambulatory Visit (INDEPENDENT_AMBULATORY_CARE_PROVIDER_SITE_OTHER): Payer: Medicare Other | Admitting: Gastroenterology

## 2021-03-15 ENCOUNTER — Telehealth (INDEPENDENT_AMBULATORY_CARE_PROVIDER_SITE_OTHER): Payer: Self-pay

## 2021-03-15 VITALS — BP 147/83 | HR 121 | Temp 97.7°F | Ht 63.0 in | Wt 165.5 lb

## 2021-03-15 DIAGNOSIS — K2101 Gastro-esophageal reflux disease with esophagitis, with bleeding: Secondary | ICD-10-CM

## 2021-03-15 DIAGNOSIS — K920 Hematemesis: Secondary | ICD-10-CM | POA: Diagnosis not present

## 2021-03-15 DIAGNOSIS — Z01812 Encounter for preprocedural laboratory examination: Secondary | ICD-10-CM

## 2021-03-15 DIAGNOSIS — B1920 Unspecified viral hepatitis C without hepatic coma: Secondary | ICD-10-CM

## 2021-03-15 MED ORDER — PEG 3350-KCL-NA BICARB-NACL 420 G PO SOLR: 4000.0000 mL | ORAL | 0 refills | Status: DC

## 2021-03-15 MED ORDER — ONDANSETRON HCL 4 MG PO TABS: 4.0000 mg | ORAL_TABLET | Freq: Three times a day (TID) | ORAL | 2 refills | Status: DC | PRN

## 2021-03-15 NOTE — Telephone Encounter (Signed)
Kathleen Mcpherson, CMA  

## 2021-03-15 NOTE — Patient Instructions (Addendum)
Continue pantoprazole 40 mg twice a day Explained presumed etiology of reflux symptoms. Instruction provided in the use of antireflux medication - patient should take medication in the morning 30-45 minutes before eating breakfast. Discussed avoidance of eating within 2 hours of lying down to sleep and benefit of blocks to elevate head of bed. Also, will benefit from avoiding carbonated drinks/sodas or food that has tomatoes, spicy or greasy food. Continue Carafate Schedule EGD and colonoscopy in first week of January Perform blood workup Schedule liver elastography

## 2021-03-15 NOTE — Progress Notes (Signed)
Kathleen Mcpherson, M.D. Gastroenterology & Hepatology [facility] [address] [place], [place] [address] Primary Care Physician: Kathleen Croon, MD [facility] [place] [address]  Referring MD: PCP  Chief Complaint:  follow up grade D esophagitis, hepatitis C  History of Present Illness: Kathleen Mcpherson is a 50 y.o. female with PMH hepatitis C genotype 2B, COPD, Addison's disease, GERD, who presents for evaluation of after recent hospitalization for upper GI bleeding where she was found to have grade D esophagitis and follow-up of hepatitis C.  Patient is knitting during the visit and is focused on doing this, does not keep eye contact frequently. Briefly, the patient was admitted to [facility] on 01/10/2021 after presenting recurrent episode of hematemesis with a drop in her hemoglobin down to 7.5.  Due to this she was transfused 2 units of PRBC.  As part of the investigations she had performed she was found to have a large hiatal hernia in her CT of the abdomen.  She subsequently underwent EGD during the same hospitalization was found to have grade D esophagitis, there was presence of medium sized hiatal hernia, areas of gastritis with negative biopsies for H. pylori and normal duodenum.  The patient was discharged from the hospital on PPI twice daily and Carafate every 6 hours. Most recent labs from 01/11/2021 showed positive HCV RNA with viral load of 2850000, genotype 2B, neg HIV. Most recent CMP showed AST 53, ALT 42, albumin 2.6, normal electrolytes Cr 0.68. Her mors recent CT abdomen and pelvis with IV contrast did not show any nodular liver contour.  Patient reports that she is taking pantoprazole 40 mg twice a day compliantly. She has been taking Carafate every 6 hours as advised.  Patient reports feeling fine but states that she has presented ongoing recurrent nausea. She is not taking anything for nausea at the moment,  used to take Zofran which she believes it helped her. Does not vomit but would like to have a refill of medication.  She feels bloated occasionally.The patient denies having any nausea, vomiting, fever, chills, hematochezia, melena, hematemesis, abdominal pain, diarrhea, jaundice, pruritus or weight loss.  Patient never started treatment of hepatitis C in the past as she "lost contact with our office". Believes possibly she acquired by sharing her toothbrush with her sister as she had hepatitis C. She used to do opiates in the past but denies doing any other drugs.  Last EGD: as above Last Colonoscopy: prior to 2013, patient believes it was normal  FHx: neg for any gastrointestinal/liver disease, aunt breast cancer , mother cancer of mouth Social: smokes 8-10 cigs a day, denies alcohol. Used to do opiates in the past but quit >5 years ago Surgical: cholecystectomy  Past Medical History: Past Medical History:  Diagnosis Date   Addison disease (HCC)    COPD (chronic obstructive pulmonary disease) (HCC)    Fibromyalgia    Hepatitis C     Past Surgical History: Past Surgical History:  Procedure Laterality Date   CHOLECYSTECTOMY     ESOPHAGOGASTRODUODENOSCOPY  11/23/2011   erosive/ulcerative reflux esophagitis, bulbar duodenitis.   ESOPHAGOGASTRODUODENOSCOPY (EGD) WITH PROPOFOL N/A 01/11/2021   Procedure: ESOPHAGOGASTRODUODENOSCOPY (EGD) WITH PROPOFOL;  Surgeon: Kathleen Bal, DO;  Location: [facility];  Service: Endoscopy;  Laterality: N/A;  possible dilation   INCISION AND DRAINAGE ABSCESS Left 05/31/2015   Procedure: INCISION AND DRAINAGE ABSCESS left foot;  Surgeon: Kathleen Hearing, MD;  Location: [facility];  Service: Orthopedics;  Laterality: Left;   ORTHOPEDIC SURGERY     knee surgery    Family History: Family History  Problem Relation Age of Onset   Colon cancer Neg Hx     Social History: Social History   Tobacco Use  Smoking Status Every Day   Packs/day: 0.50    Types: Cigarettes  Smokeless Tobacco Never  Tobacco Comments   1 pack a day.   Social History   Substance and Sexual Activity  Alcohol Use No   Social History   Substance and Sexual Activity  Drug Use No   Comment: In the past, she smoke cocaine and marijuana. No IV drugs.    Allergies: Allergies  Allergen Reactions   Asa [Aspirin] Other (See Comments)    G.I.Upset   Prednisone Itching   Tape    Tylenol [Acetaminophen] Other (See Comments)    G.I. Upset    Medications: Current Outpatient Medications  Medication Sig Dispense Refill   omeprazole (PRILOSEC) 40 MG capsule Take 40 mg by mouth 2 (two) times daily.     sucralfate (CARAFATE) 1 g tablet Take 1 tablet (1 g total) by mouth 4 (four) times daily. 120 tablet 0   albuterol (PROVENTIL) (2.5 MG/3ML) 0.083% nebulizer solution Take 2.5 mg by nebulization every 6 (six) hours as needed for wheezing or shortness of breath. (Patient not taking: Reported on 03/15/2021)     busPIRone (BUSPAR) 10 MG tablet Take 10 mg by mouth 3 (three) times daily. (Patient not taking: Reported on 03/15/2021)  1   clonazePAM (KLONOPIN) 0.5 MG tablet Take 0.5 mg by mouth 2 (two) times daily. (Patient not taking: Reported on 03/15/2021)  5   pantoprazole (PROTONIX) 40 MG tablet Take 1 tablet (40 mg total) by mouth 2 (two) times daily. (Patient not taking: Reported on 03/15/2021) 30 tablet 3   No current facility-administered medications for this visit.    Review of Systems: GENERAL: negative for malaise, night sweats HEENT: No changes in Mcpherson or vision, no nose bleeds or other nasal problems. NECK: Negative for lumps, goiter, pain and significant neck swelling RESPIRATORY: Negative for cough, wheezing CARDIOVASCULAR: Negative for chest pain, leg swelling, palpitations, orthopnea GI: SEE HPI MUSCULOSKELETAL: Negative for joint pain or swelling, back pain, and muscle pain. SKIN: Negative for lesions, rash PSYCH: Negative for sleep disturbance,  mood disorder and recent psychosocial stressors. HEMATOLOGY Negative for prolonged bleeding, bruising easily, and swollen nodes. ENDOCRINE: Negative for cold or heat intolerance, polyuria, polydipsia and goiter. NEURO: negative for tremor, gait imbalance, syncope and seizures. The remainder of the review of systems is noncontributory.   Physical Exam: BP (!) 147/83 (BP Location: Left Arm, Patient Position: Sitting, Cuff Size: Large)   Pulse (!) 121   Temp 97.7 F (36.5 C) (Oral)   Ht 5\' 3"  (1.6 m)   Wt 165 lb 8 oz (75.1 kg)   LMP 11/16/2011   BMI 29.32 kg/m  GENERAL: The patient is AO x3, in no acute distress. HEENT: Head is normocephalic and atraumatic. EOMI are intact. Mouth is well hydrated and without lesions. NECK: Supple. No masses LUNGS: Clear to auscultation. No presence of rhonchi/wheezing/rales. Adequate chest expansion HEART: RRR, normal s1 and s2. ABDOMEN: Soft, nontender, no guarding, no peritoneal signs, and nondistended. BS +. No masses. EXTREMITIES: Without any cyanosis, clubbing, rash, lesions or edema. NEUROLOGIC: AOx3, no focal motor deficit. SKIN: no jaundice, no rashes   Imaging/Labs: as above  I personally reviewed and interpreted the available labs, imaging and endoscopic  files.  Impression and Plan: ALYNN ELLITHORPE is a 50 y.o. female with PMH hepatitis C genotype 2B, COPD, Addison's disease, GERD, who presents for evaluation of after recent hospitalization for upper GI bleeding where she was found to have grade D esophagitis and follow-up of hepatitis C. patient presented an acute drop in his hemoglobin in the setting of hematemesis was found to have severe esophagitis but no presence of any active bleeding during her most recent endoscopic investigation.  Since she was discharged from the hospital her symptoms have completely resolved as she has been compliant with her PPI.  I consider she should keep taking this medication for at least the next month and  we will proceed with a repeat EGD during the first week of January to evaluate her esophagitis healing.  We will also recheck a CBC today to evaluate if her hemoglobin has improved after her hospitalization.  As it is likely she had her colonoscopy more than 10 years ago for screening purposes, we will proceed with a repeat procedure on the same day of her esophagogastroduodenospy.  She agreed and understood with this plan.  We will obtain a U tox prior to both procedures.  for now, she will need to continue on pantoprazole 40 mg twice a day and she will also need to continue taking Carafate until she finishes her current prescription.   finally, she has active chronic hepatitis C with presence of elevated liver enzymes, primarily her AST>ALT.  Her albumin is low which raises the concern for possible cirrhosis, although her abdominal imaging was not consistent with this.  We will perform a liver elastography to rule this out.  She had most of the work-up for her hepatitis C genotype 2B performed during her most recent hospitalization but will need to check for hepatitis B serologies before committing to a treatment option.  - Continue pantoprazole 40 mg twice a day - Explained presumed etiology of reflux symptoms. Instruction provided in the use of antireflux medication - patient should take medication in the morning 30-45 minutes before eating breakfast. Discussed avoidance of eating within 2 hours of lying down to sleep and benefit of blocks to elevate head of bed. - - Also, will benefit from avoiding carbonated drinks/sodas or food that has tomatoes, spicy or greasy food. -Continue Carafate - Schedule EGD and colonoscopy in first week of January - Check CBC, HBV serology and INR - Can take Zofran as needed for nausea - Schedule liver elastography  All questions were answered.      Kathleen Blazing, MD Gastroenterology and Hepatology [facility]

## 2021-03-23 ENCOUNTER — Ambulatory Visit (HOSPITAL_COMMUNITY): Payer: Medicare Other | Attending: Gastroenterology

## 2021-04-06 ENCOUNTER — Telehealth (INDEPENDENT_AMBULATORY_CARE_PROVIDER_SITE_OTHER): Payer: Self-pay | Admitting: *Deleted

## 2021-04-06 ENCOUNTER — Other Ambulatory Visit (INDEPENDENT_AMBULATORY_CARE_PROVIDER_SITE_OTHER): Payer: Self-pay

## 2021-04-06 NOTE — Telephone Encounter (Signed)
Pt states both legs and feet are swelling. States it has been going on for a long time. Goes down if she props them up for awhile. Has sob but states it is not worse than normal. Has copd. No pain in calf. States her feet hurt so bad its hard to walk at times. States her pcp referred her to cardiologist but they did not take her insurance. Also asking for a refill on triamcinolone cream she uses on her legs. Advised patient that she should call pcp today or if unable to get in touch with pcp she should go to urgent care. Pt verbalized understanding.

## 2021-04-07 ENCOUNTER — Ambulatory Visit (HOSPITAL_COMMUNITY): Admission: RE | Admit: 2021-04-07 | Payer: Medicare Other | Source: Home / Self Care | Admitting: Gastroenterology

## 2021-04-07 ENCOUNTER — Encounter (HOSPITAL_COMMUNITY): Admission: RE | Payer: Self-pay | Source: Home / Self Care

## 2021-04-07 ENCOUNTER — Telehealth (INDEPENDENT_AMBULATORY_CARE_PROVIDER_SITE_OTHER): Payer: Self-pay | Admitting: *Deleted

## 2021-04-07 SURGERY — COLONOSCOPY WITH PROPOFOL
Anesthesia: Monitor Anesthesia Care

## 2021-04-07 NOTE — Telephone Encounter (Signed)
Patient called and states pharm told her prep was not sent to pharm and that's why she did not do tcs and egd. She is now wanting to reschedule both.

## 2021-04-27 LAB — CBC WITH DIFFERENTIAL/PLATELET
Absolute Monocytes: 342 cells/uL (ref 200–950)
Basophils Absolute: 67 cells/uL (ref 0–200)
Basophils Relative: 1.1 %
Eosinophils Absolute: 98 cells/uL (ref 15–500)
Eosinophils Relative: 1.6 %
HCT: 30.8 % — ABNORMAL LOW (ref 35.0–45.0)
Hemoglobin: 9.5 g/dL — ABNORMAL LOW (ref 11.7–15.5)
Lymphs Abs: 1800 cells/uL (ref 850–3900)
MCH: 23.1 pg — ABNORMAL LOW (ref 27.0–33.0)
MCHC: 30.8 g/dL — ABNORMAL LOW (ref 32.0–36.0)
MCV: 74.8 fL — ABNORMAL LOW (ref 80.0–100.0)
MPV: 9.8 fL (ref 7.5–12.5)
Monocytes Relative: 5.6 %
Neutro Abs: 3794 cells/uL (ref 1500–7800)
Neutrophils Relative %: 62.2 %
Platelets: 171 10*3/uL (ref 140–400)
RBC: 4.12 10*6/uL (ref 3.80–5.10)
RDW: 14.4 % (ref 11.0–15.0)
Total Lymphocyte: 29.5 %
WBC: 6.1 10*3/uL (ref 3.8–10.8)

## 2021-04-27 LAB — PROTIME-INR
INR: 1
Prothrombin Time: 10.6 s (ref 9.0–11.5)

## 2021-04-27 LAB — HEPATITIS B CORE ANTIBODY, TOTAL: Hep B Core Total Ab: NONREACTIVE

## 2021-04-27 LAB — HEPATITIS B SURFACE ANTIGEN: Hepatitis B Surface Ag: NONREACTIVE

## 2021-07-15 ENCOUNTER — Encounter (INDEPENDENT_AMBULATORY_CARE_PROVIDER_SITE_OTHER): Payer: Self-pay | Admitting: Gastroenterology

## 2021-07-15 ENCOUNTER — Ambulatory Visit (INDEPENDENT_AMBULATORY_CARE_PROVIDER_SITE_OTHER): Payer: Medicare Other | Admitting: Gastroenterology

## 2021-09-16 ENCOUNTER — Encounter (INDEPENDENT_AMBULATORY_CARE_PROVIDER_SITE_OTHER): Payer: Self-pay | Admitting: Gastroenterology

## 2021-09-16 ENCOUNTER — Ambulatory Visit (INDEPENDENT_AMBULATORY_CARE_PROVIDER_SITE_OTHER): Payer: Medicare HMO | Admitting: Gastroenterology

## 2021-10-10 IMAGING — CT CT ABD-PELV W/ CM
2 of 5 series · 16 of 46 positions shown, 18 images · IV contrast (omnipaque)
Comparison: October 25, 2011

CLINICAL DATA: Acute epigastric pain.  Assess for gastric ulcer.

EXAM:
CT ABDOMEN AND PELVIS WITH CONTRAST
TECHNIQUE: Multidetector CT imaging of the abdomen and pelvis was performed
using the standard protocol following bolus administration of
intravenous contrast.
CONTRAST:  100mL OMNIPAQUE IOHEXOL 300 MG/ML  SOLN

[Series 2: axial st · axial · 0.98mm/px · z∈[+726,+1121]mm · 13 of 89 slices shown, 15 images]
[im 5/89  soft-tissue]
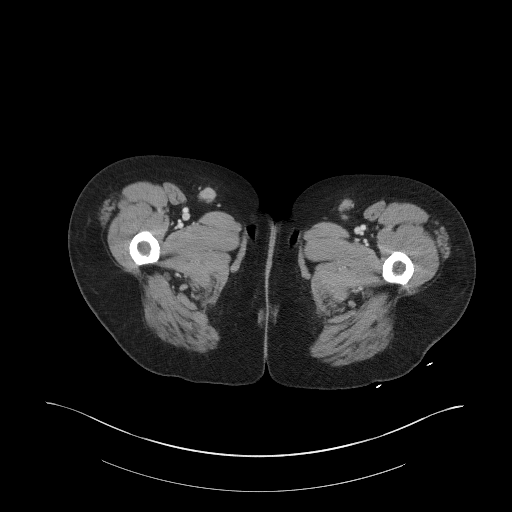
[im 5/89  bone]
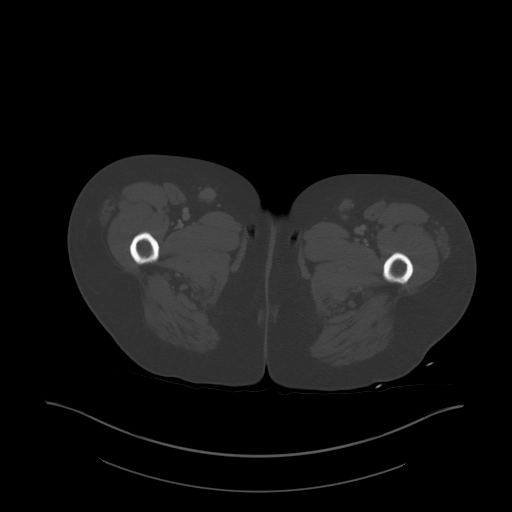
[im 10/89  soft-tissue]
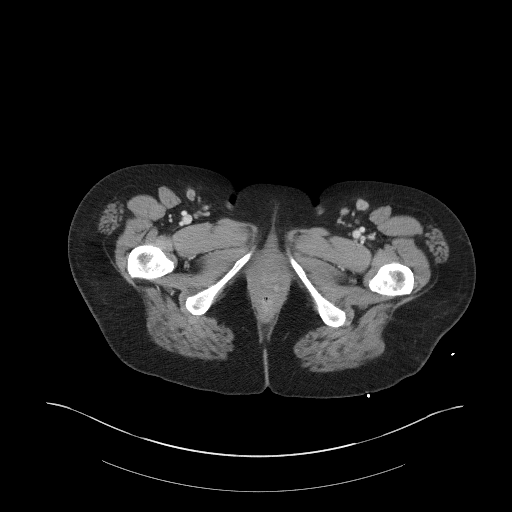
[im 20/89  soft-tissue]
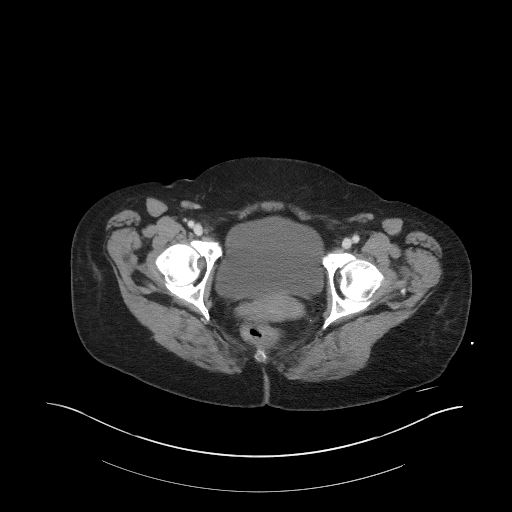
[im 25/89  soft-tissue]
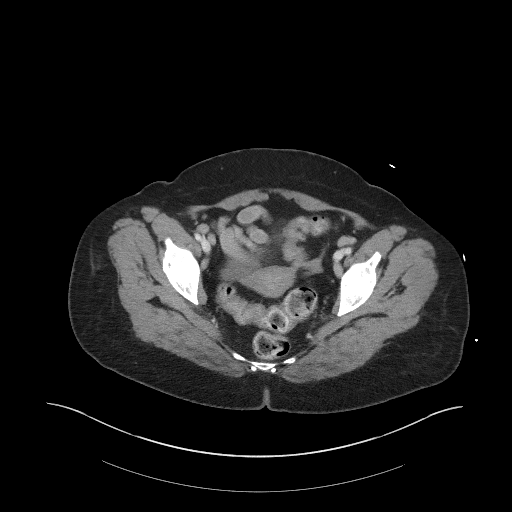
[im 30/89  soft-tissue]
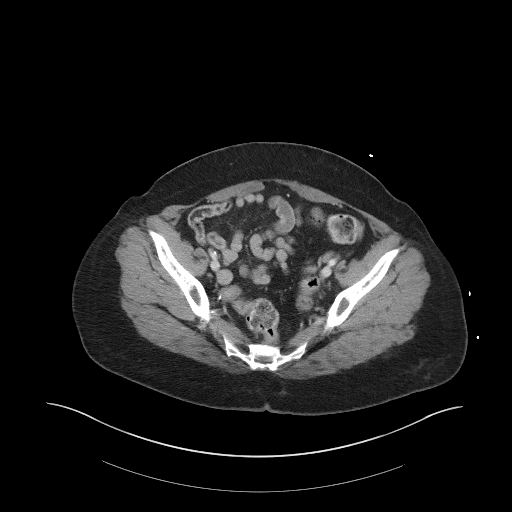
[im 40/89  soft-tissue]
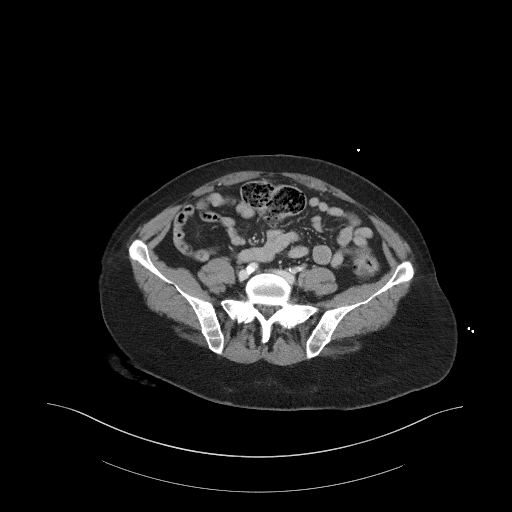
[im 45/89  soft-tissue]
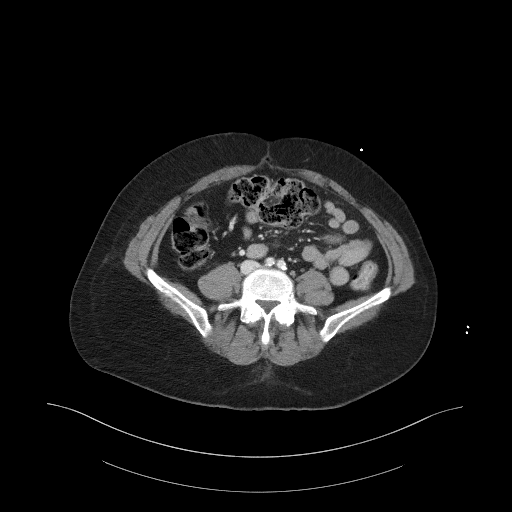
[im 49/89  soft-tissue]
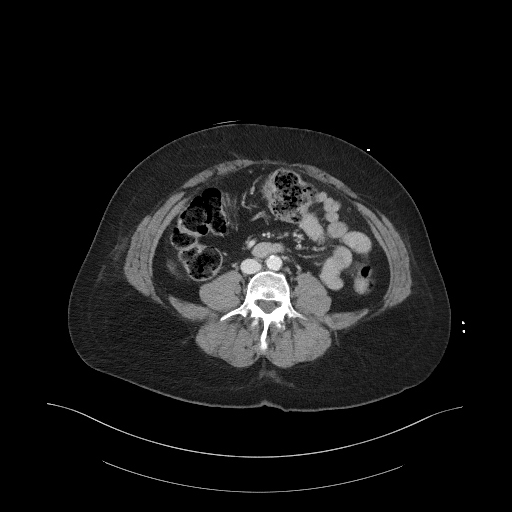
[im 59/89  soft-tissue]
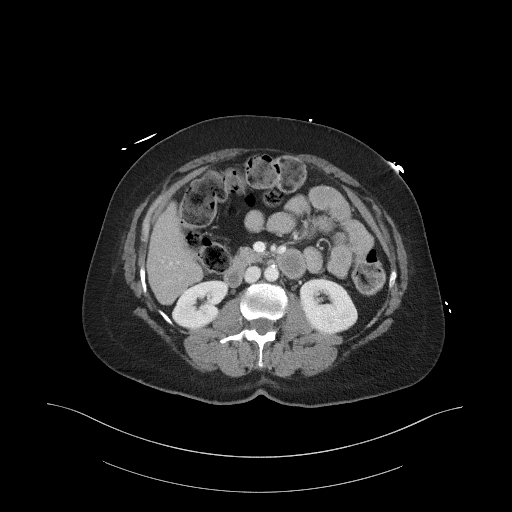
[im 59/89  bone]
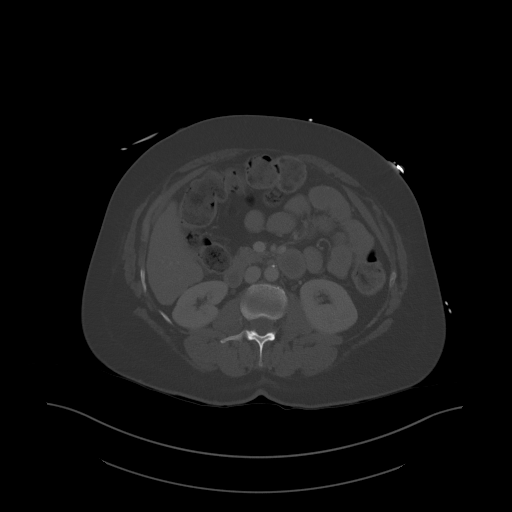
[im 64/89  soft-tissue]
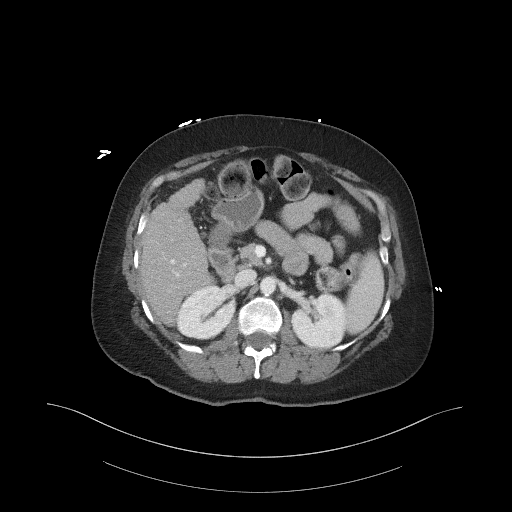
[im 69/89  soft-tissue]
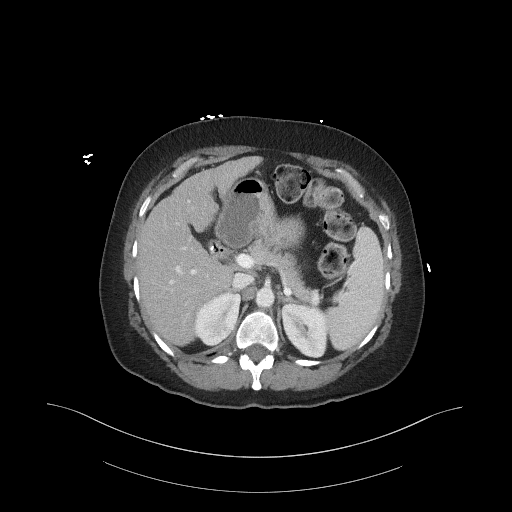
[im 79/89  soft-tissue]
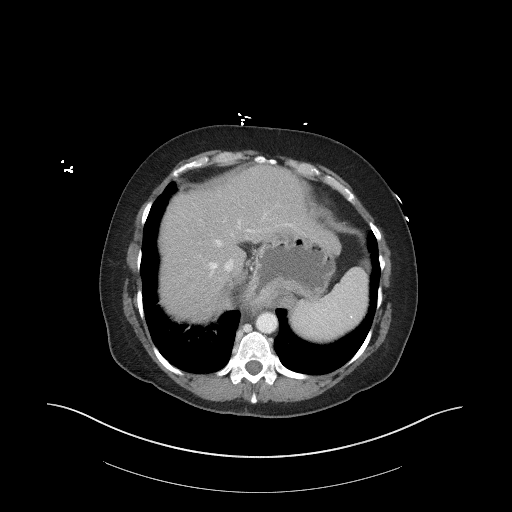
[im 84/89  soft-tissue]
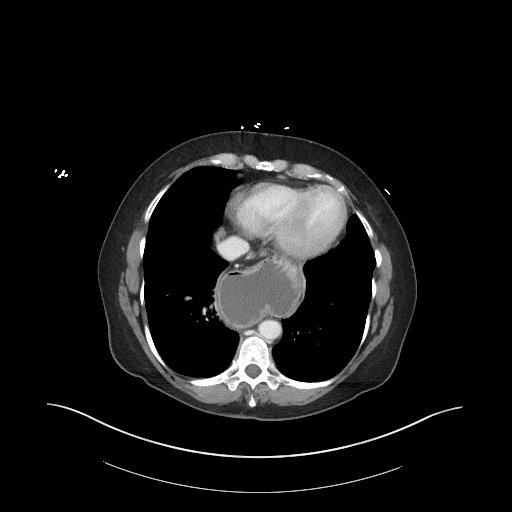

[Series 5: coronal st · coronal · 0.77mm/px · 3 of 117 slices shown]
[im 39/117  soft-tissue]
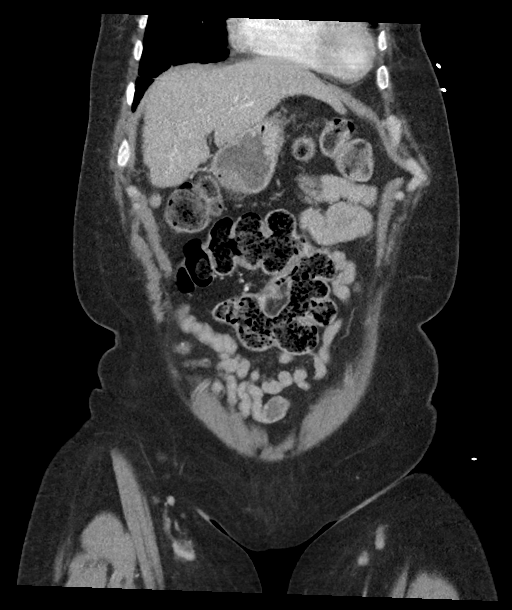
[im 52/117  soft-tissue]
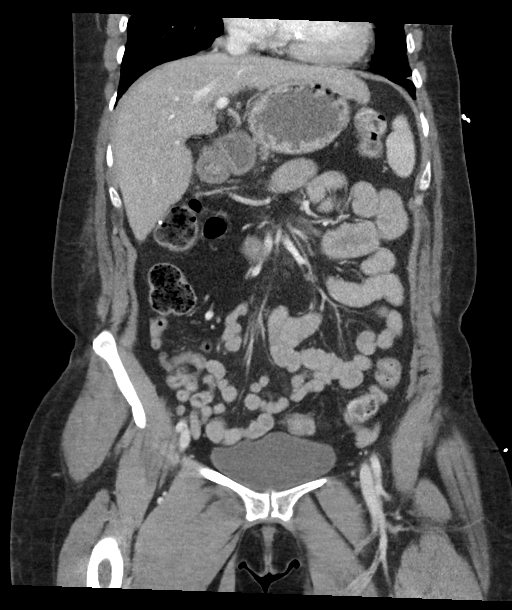
[im 65/117  soft-tissue]
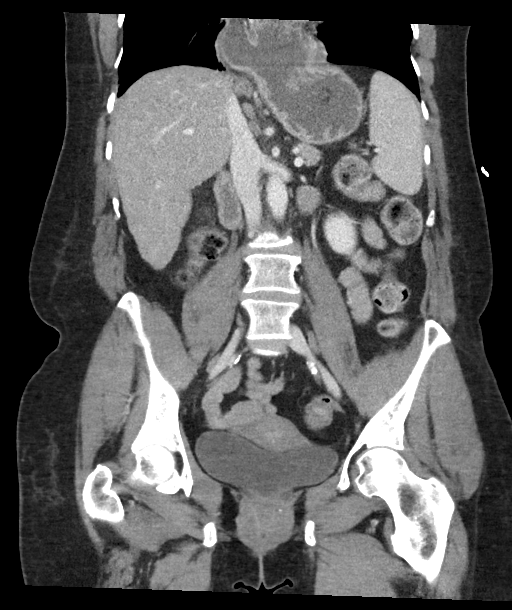

[16 of 46 positions shown; findings below may reference images not displayed]

FINDINGS: Lower chest: No acute abnormality.

Hepatobiliary: No focal liver abnormality is seen. Status post
cholecystectomy. No biliary dilatation.

Pancreas: Unremarkable. No pancreatic ductal dilatation or
surrounding inflammatory changes.

Spleen: Normal in size without focal abnormality.

Adrenals/Urinary Tract: Adrenal glands are unremarkable. Kidneys are
normal, without renal calculi, focal lesion, or hydronephrosis.
Bladder is unremarkable.

Stomach/Bowel: Moderate to large hiatal hernia is identified
unchanged compared prior exam. The stomach is otherwise
unremarkable. There is no small bowel obstruction. Colon is normal.
Moderate bowel content is identified throughout colon. The appendix
is not seen but no inflammation is noted around cecum.

Vascular/Lymphatic: Aortic atherosclerosis. No enlarged abdominal or
pelvic lymph nodes.

Reproductive: Uterus and bilateral adnexa are unremarkable.

Other: None.

Musculoskeletal: No acute abnormality.
IMPRESSION: 1. No acute abnormality identified in the abdomen and pelvis.
2. Moderate to large hiatal hernia unchanged compared prior exam.
3. Moderate bowel content is identified throughout colon. This can
be seen in constipation.

Aortic Atherosclerosis ([id number]).

## 2021-11-15 ENCOUNTER — Encounter (INDEPENDENT_AMBULATORY_CARE_PROVIDER_SITE_OTHER): Payer: Self-pay | Admitting: Gastroenterology

## 2021-11-15 ENCOUNTER — Ambulatory Visit (INDEPENDENT_AMBULATORY_CARE_PROVIDER_SITE_OTHER): Payer: Medicare HMO | Admitting: Gastroenterology

## 2021-11-15 VITALS — BP 137/74 | HR 108 | Temp 98.4°F | Ht 64.0 in | Wt 158.1 lb

## 2021-11-15 DIAGNOSIS — B1920 Unspecified viral hepatitis C without hepatic coma: Secondary | ICD-10-CM | POA: Diagnosis not present

## 2021-11-15 DIAGNOSIS — K2101 Gastro-esophageal reflux disease with esophagitis, with bleeding: Secondary | ICD-10-CM

## 2021-11-15 DIAGNOSIS — K920 Hematemesis: Secondary | ICD-10-CM

## 2021-11-15 DIAGNOSIS — R110 Nausea: Secondary | ICD-10-CM | POA: Diagnosis not present

## 2021-11-15 MED ORDER — ONDANSETRON HCL 4 MG PO TABS: 4.0000 mg | ORAL_TABLET | Freq: Three times a day (TID) | ORAL | 2 refills | Status: AC | PRN

## 2021-11-15 MED ORDER — OMEPRAZOLE 40 MG PO CPDR: 40.0000 mg | DELAYED_RELEASE_CAPSULE | Freq: Two times a day (BID) | ORAL | 3 refills | Status: DC

## 2021-11-15 NOTE — Progress Notes (Unsigned)
Kathleen Mcpherson, M.D. Gastroenterology & Hepatology [facility] [address] [place], [place] [address]  Primary Care Physician: Evelene Croon, MD [facility] [place] [address]  I will communicate my assessment and recommendations to the referring MD via EMR.  Problems: Esophagitis GERD Hepatitis C genotype 2b  History of Present Illness: Kathleen Mcpherson is a 51 y.o. female with PMH hepatitis C genotype 2B, COPD, Addison's disease, GERD, who presents for follow-up of hepatitis C and GERD.  The patient was last seen on 03/15/2021. At that time, the patient was advised to continue pantoprazole 40 mg twice a day for GERD.  She was scheduled for an EGD and colonoscopy.Liver elastography was ordered but she did not perform this.  Blood work-up was checked in January 2023, INR was normal at 1.0, CBC showed a hemoglobin of 9.5, although cell count of 6.1 and platelets of 171, hepatitis B surface antigen and core antibody were negative.  Notably, the patient canceled her colonoscopy and did not show to two previous follow up appointments. States she did not have a ride for her appointments.  Patient reports that she feels recurrent frequent burning episodes in her chest. She reports she is feeling the burning in her chest all the time. States that she did not feel significant  improvement with omeprazole 40 mg twice a day. She states that Pepcid is similar to omeprazole, although it works faster.  The patient also reports having nausea but no vomiting. States she ran out of Zofran which used to help her with the nausea.  She also reports havign bloating of her abdomen and "gas like pain" frequently.  No melena or hematochezia.  Has stopped using aspirin compounds since November.  The patient denies having any fever, chills, hematochezia, melena, hematemesis, diarrhea, jaundice, pruritus or weight loss.  Last EGD:01/2021 grade D  esophagitis, there was presence of medium sized hiatal hernia, areas of gastritis with negative biopsies for H. pylori and normal duodenum.   Last Colonoscopy:prior to 2013, patient believes it was normal  Past Medical History: Past Medical History:  Diagnosis Date   Addison disease (HCC)    COPD (chronic obstructive pulmonary disease) (HCC)    Fibromyalgia    Hepatitis C     Past Surgical History: Past Surgical History:  Procedure Laterality Date   CHOLECYSTECTOMY     ESOPHAGOGASTRODUODENOSCOPY  11/23/2011   erosive/ulcerative reflux esophagitis, bulbar duodenitis.   ESOPHAGOGASTRODUODENOSCOPY (EGD) WITH PROPOFOL N/A 01/11/2021   Procedure: ESOPHAGOGASTRODUODENOSCOPY (EGD) WITH PROPOFOL;  Surgeon: Lanelle Bal, DO;  Location: [facility];  Service: Endoscopy;  Laterality: N/A;  possible dilation   INCISION AND DRAINAGE ABSCESS Left 05/31/2015   Procedure: INCISION AND DRAINAGE ABSCESS left foot;  Surgeon: Vickki Hearing, MD;  Location: [facility];  Service: Orthopedics;  Laterality: Left;   ORTHOPEDIC SURGERY     knee surgery    Family History: Family History  Problem Relation Age of Onset   Colon cancer Neg Hx     Social History: Social History   Tobacco Use  Smoking Status Every Day   Packs/day: 0.50   Types: Cigarettes  Smokeless Tobacco Never  Tobacco Comments   1 pack a day.   Social History   Substance and Sexual Activity  Alcohol Use No   Social History   Substance and Sexual Activity  Drug Use No   Comment: In the past, she smoke cocaine and marijuana. No IV drugs.    Allergies: Allergies  Allergen Reactions   Asa [Aspirin] Other (See Comments)    G.I.Upset   Prednisone Itching   Tape    Tylenol [Acetaminophen] Other (See Comments)    G.I. Upset    Medications: Current Outpatient Medications  Medication Sig Dispense Refill   famotidine (PEPCID) 10 MG tablet Take 10 mg by mouth 2 (two) times daily.     albuterol (PROVENTIL) (2.5  MG/3ML) 0.083% nebulizer solution Take 2.5 mg by nebulization every 6 (six) hours as needed for wheezing or shortness of breath. (Patient not taking: Reported on 03/15/2021)     busPIRone (BUSPAR) 10 MG tablet Take 10 mg by mouth 3 (three) times daily. (Patient not taking: Reported on 03/15/2021)  1   clonazePAM (KLONOPIN) 0.5 MG tablet Take 0.5 mg by mouth 2 (two) times daily. (Patient not taking: Reported on 03/15/2021)  5   omeprazole (PRILOSEC) 40 MG capsule Take 40 mg by mouth 2 (two) times daily. (Patient not taking: Reported on 11/15/2021)     ondansetron (ZOFRAN) 4 MG tablet Take 1 tablet (4 mg total) by mouth every 8 (eight) hours as needed for nausea or vomiting. (Patient not taking: Reported on 11/15/2021) 30 tablet 2   pantoprazole (PROTONIX) 40 MG tablet Take 1 tablet (40 mg total) by mouth 2 (two) times daily. (Patient not taking: Reported on 03/15/2021) 30 tablet 3   sucralfate (CARAFATE) 1 g tablet Take 1 tablet (1 g total) by mouth 4 (four) times daily. (Patient not taking: Reported on 11/15/2021) 120 tablet 0   No current facility-administered medications for this visit.    Review of Systems: GENERAL: negative for malaise, night sweats HEENT: No changes in hearing or vision, no nose bleeds or other nasal problems. NECK: Negative for lumps, goiter, pain and significant neck swelling RESPIRATORY: Negative for cough, wheezing CARDIOVASCULAR: Negative for chest pain, leg swelling, palpitations, orthopnea GI: SEE HPI MUSCULOSKELETAL: Negative for joint pain or swelling, back pain, and muscle pain. SKIN: Negative for lesions, rash PSYCH: Negative for sleep disturbance, mood disorder and recent psychosocial stressors. HEMATOLOGY Negative for prolonged bleeding, bruising easily, and swollen nodes. ENDOCRINE: Negative for cold or heat intolerance, polyuria, polydipsia and goiter. NEURO: negative for tremor, gait imbalance, syncope and seizures. The remainder of the review of systems is  noncontributory.   Physical Exam: BP 137/74 (BP Location: Left Arm, Patient Position: Sitting, Cuff Size: Large)   Pulse (!) 108   Temp 98.4 F (36.9 C) (Oral)   Ht 5\' 4"  (1.626 m)   Wt 158 lb 1.6 oz (71.7 kg)   LMP 11/16/2011   BMI 27.14 kg/m  GENERAL: The patient is AO x3, in no acute distress. HEENT: Head is normocephalic and atraumatic. EOMI are intact. Mouth is well hydrated and without lesions. NECK: Supple. No masses LUNGS: Clear to auscultation. No presence of rhonchi/wheezing/rales. Adequate chest expansion HEART: RRR, normal s1 and s2. ABDOMEN: Soft, nontender, no guarding, no peritoneal signs, and nondistended. BS +. No masses. EXTREMITIES: Without any cyanosis, clubbing, rash, lesions or edema. NEUROLOGIC: AOx3, no focal motor deficit. SKIN: no jaundice, no rashes  Imaging/Labs: as above  I personally reviewed and interpreted the available labs, imaging and endoscopic files.  Impression and Plan: Kathleen Mcpherson is a 51 y.o. female with PMH hepatitis C genotype 2B, COPD, Addison's disease, GERD, who presents for follow-up of hepatitis C and GERD.  The patient had recurrence of episodes of reflux after she ran out of her PPI but has presented some mild improvement of her symptoms while  on famotidine.  I explained that she should ideally be on PPI given the severity of her reflux.  She will stop Pepcid and then she will be restarted on omeprazole 40 mg twice a day.  We will need to proceed with a repeat EGD to assess the distal esophagus once it has healed properly.  This will also help with evaluating her nausea symptoms.  We will schedule her for a colonoscopy as well as biopsies due for colorectal cancer screening.  Finally, the patient has chronic hepatitis C due to genotype 2B.  She is treatment nave it is unclear if she has any degree of advanced fibrosis as she did not perform her elastography in the past.  We will need to determine if she is compliant with medical  care as she did not pursue the previous work-up ordered in her last visit, which raises the concern for compliance to antiviral therapy in the future..  -Schedule EGD and colonoscopy -Restart omeprazole 40 mg twice a day -Stop Pepcid -Will discuss management of hepatitis C in next appointment, will need to  evaluate compliance to medical care  All questions were answered.      Harvel Quale, MD Gastroenterology and Hepatology [facility]

## 2021-11-15 NOTE — Patient Instructions (Signed)
Schedule EGD and colonoscopy Restart omeprazole 40 mg twice a day Stop Pepcid Will discuss management of hepatitis C in next appointment, will need to  evaluate compliance to medical care

## 2021-11-24 ENCOUNTER — Encounter (INDEPENDENT_AMBULATORY_CARE_PROVIDER_SITE_OTHER): Payer: Self-pay

## 2021-11-24 ENCOUNTER — Other Ambulatory Visit (INDEPENDENT_AMBULATORY_CARE_PROVIDER_SITE_OTHER): Payer: Self-pay

## 2021-11-24 ENCOUNTER — Telehealth (INDEPENDENT_AMBULATORY_CARE_PROVIDER_SITE_OTHER): Payer: Self-pay

## 2021-11-24 MED ORDER — PEG 3350-KCL-NA BICARB-NACL 420 G PO SOLR: 4000.0000 mL | ORAL | 0 refills | Status: DC

## 2021-11-24 NOTE — Telephone Encounter (Signed)
Lamoine Fredricksen Ann Aravind Chrismer, CMA  ?

## 2021-11-25 ENCOUNTER — Encounter (INDEPENDENT_AMBULATORY_CARE_PROVIDER_SITE_OTHER): Payer: Self-pay

## 2021-12-13 ENCOUNTER — Encounter (HOSPITAL_COMMUNITY)
Admission: RE | Admit: 2021-12-13 | Discharge: 2021-12-13 | Disposition: A | Discharge status: Home / Self Care | Payer: Medicare PPO | Source: Ambulatory Visit | Attending: Gastroenterology | Admitting: Gastroenterology

## 2021-12-13 ENCOUNTER — Encounter (HOSPITAL_COMMUNITY): Payer: Self-pay

## 2021-12-13 DIAGNOSIS — Z01818 Encounter for other preprocedural examination: Secondary | ICD-10-CM

## 2021-12-13 HISTORY — DX: Nausea with vomiting, unspecified: Z98.890

## 2021-12-13 HISTORY — DX: Nausea with vomiting, unspecified: R11.2

## 2021-12-13 HISTORY — DX: Anxiety disorder, unspecified: F41.9

## 2021-12-13 HISTORY — DX: Depression, unspecified: F32.A

## 2021-12-13 NOTE — Patient Instructions (Signed)
Kathleen Mcpherson  12/13/2021     @PREFPERIOPPHARMACY @   Your procedure is scheduled on 12/16/2021.  Report to 12/18/2021 at 7:45 A.M.  Call this number if you have problems the morning of surgery:  [phone]   Remember:   Please follow the diet and prep instructions given to you by Dr  [phone] office.       Take these medicines the morning of surgery with A SIP OF WATER : Buspar Prilosec and Zofran    Do not wear jewelry, make-up or nail polish.  Do not wear lotions, powders, or perfumes, or deodorant.  Do not shave 48 hours prior to surgery.  Men may shave face and neck.  Do not bring valuables to the hospital.  [facility] is not responsible for any belongings or valuables.  Contacts, dentures or bridgework may not be worn into surgery.  Leave your suitcase in the car.  After surgery it may be brought to your room.  For patients admitted to the hospital, discharge time will be determined by your treatment team.  Patients discharged the day of surgery will not be allowed to drive home.   Name and phone number of your driver:   family Special instructions:  N/A  Please read over the following fact sheets that you were given. Care and Recovery After Surgery   Colonoscopy, Adult A colonoscopy is a procedure to look at the entire large intestine. This procedure is done using a long, thin, flexible tube that has a camera on the end. You may have a colonoscopy: As a part of normal colorectal screening. If you have certain symptoms, such as: A low number of red blood cells in your blood (anemia). Diarrhea that does not go away. Pain in your abdomen. Blood in your stool. A colonoscopy can help screen for and diagnose medical problems, including: An abnormal growth of cells or tissue (tumor). Abnormal growths within the lining of your intestine (polyps). Inflammation. Areas of bleeding. Tell your health care provider about: Any allergies you have. All medicines you  are taking, including vitamins, herbs, eye drops, creams, and over-the-counter medicines. Any problems you or family members have had with anesthetic medicines. Any bleeding problems you have. Any surgeries you have had. Any medical conditions you have. Any problems you have had with having bowel movements. Whether you are pregnant or may be pregnant. What are the risks? Generally, this is a safe procedure. However, problems may occur, including: Bleeding. Damage to your intestine. Allergic reactions to medicines given during the procedure. Infection. This is rare. What happens before the procedure? Eating and drinking restrictions Follow instructions from your health care provider about eating or drinking restrictions, which may include: A few days before the procedure: Follow a low-fiber diet. Avoid nuts, seeds, dried fruit, raw fruits, and vegetables. 1-3 days before the procedure: Eat only gelatin dessert or ice pops. Drink only clear liquids, such as water, clear juice, clear broth or bouillon, black coffee or tea, or clear soft drinks or sports drinks. Avoid liquids that contain red or purple dye. The day of the procedure: Do not eat solid foods. You may continue to drink clear liquids until up to 2 hours before the procedure. Do not eat or drink anything starting 2 hours before the procedure, or within the time period that your health care provider recommends. Bowel prep If you were prescribed a bowel prep to take by mouth (orally) to clean out your colon: Take it as told by  your health care provider. Starting the day before your procedure, you will need to drink a large amount of liquid medicine. The liquid will cause you to have many bowel movements of loose stool until your stool becomes almost clear or light green. If your skin or the opening between the buttocks (anus) gets irritated from diarrhea, you may relieve the irritation using: Wipes with medicine in them, such as  adult wet wipes with aloe and vitamin E. A product to soothe skin, such as petroleum jelly. If you vomit while drinking the bowel prep: Take a break for up to 60 minutes. Begin the bowel prep again. Call your health care provider if you keep vomiting or you cannot take the bowel prep without vomiting. To clean out your colon, you may also be given: Laxative medicines. These help you have a bowel movement. Instructions for enema use. An enema is liquid medicine injected into your rectum. Medicines Ask your health care provider about: Changing or stopping your regular medicines or supplements. This is especially important if you are taking iron supplements, diabetes medicines, or blood thinners. Taking medicines such as aspirin and ibuprofen. These medicines can thin your blood. Do not take these medicines unless your health care provider tells you to take them. Taking over-the-counter medicines, vitamins, herbs, and supplements. General instructions Ask your health care provider what steps will be taken to help prevent infection. These may include washing skin with a germ-killing soap. If you will be going home right after the procedure, plan to have a responsible adult: Take you home from the hospital or clinic. You will not be allowed to drive. Care for you for the time you are told. What happens during the procedure?  An IV will be inserted into one of your veins. You will be given a medicine to make you fall asleep (general anesthetic). You will lie on your side with your knees bent. A lubricant will be put on the tube. Then the tube will be: Inserted into your anus. Gently eased through all parts of your large intestine. Air will be sent into your colon to keep it open. This may cause some pressure or cramping. Images will be taken with the camera and will appear on a screen. A small tissue sample may be removed to be looked at under a microscope (biopsy). The tissue may be sent to a  lab for testing if any signs of problems are found. If small polyps are found, they may be removed and checked for cancer cells. When the procedure is finished, the tube will be removed. The procedure may vary among health care providers and hospitals. What happens after the procedure? Your blood pressure, heart rate, breathing rate, and blood oxygen level will be monitored until you leave the hospital or clinic. You may have a small amount of blood in your stool. You may pass gas and have mild cramping or bloating in your abdomen. This is caused by the air that was used to open your colon during the exam. If you were given a sedative during the procedure, it can affect you for several hours. Do not drive or operate machinery until your health care provider says that it is safe. It is up to you to get the results of your procedure. Ask your health care provider, or the department that is doing the procedure, when your results will be ready. Summary A colonoscopy is a procedure to look at the entire large intestine. Follow instructions from your health care  provider about eating and drinking before the procedure. If you were prescribed an oral bowel prep to clean out your colon, take it as told by your health care provider. During the colonoscopy, a flexible tube with a camera on its end is inserted into the anus and then passed into all parts of the large intestine. This information is not intended to replace advice given to you by your health care provider. Make sure you discuss any questions you have with your health care provider. Document Revised: 03/15/2021 Document Reviewed: 11/11/2020 Elsevier Patient Education  2023 Elsevier Inc.    Upper Endoscopy, Adult Upper endoscopy is a procedure to look inside the upper GI (gastrointestinal) tract. The upper GI tract is made up of: The esophagus. This is the part of the body that moves food from your mouth to your stomach. The stomach. The  duodenum. This is the first part of your small intestine. This procedure is also called esophagogastroduodenoscopy (EGD) or gastroscopy. In this procedure, your health care provider passes a thin, flexible tube (endoscope) through your mouth and down your esophagus into your stomach and into your duodenum. A small camera is attached to the end of the tube. Images from the camera appear on a monitor in the exam room. During this procedure, your health care provider may also remove a small piece of tissue to be sent to a lab and examined under a microscope (biopsy). Your health care provider may do an upper endoscopy to diagnose cancers of the upper GI tract. You may also have this procedure to find the cause of other conditions, such as: Stomach pain. Heartburn. Pain or problems when swallowing. Nausea and vomiting. Stomach bleeding. Stomach ulcers. Tell a health care provider about: Any allergies you have. All medicines you are taking, including vitamins, herbs, eye drops, creams, and over-the-counter medicines. Any problems you or family members have had with anesthetic medicines. Any bleeding problems you have. Any surgeries you have had. Any medical conditions you have. Whether you are pregnant or may be pregnant. What are the risks? Your healthcare provider will talk with you about risks. These may include: Infection. Bleeding. Allergic reactions to medicines. A tear or hole (perforation) in the esophagus, stomach, or duodenum. What happens before the procedure? When to stop eating and drinking Follow instructions from your health care provider about what you may eat and drink. These may include: 8 hours before your procedure Stop eating most foods. Do not eat meat, fried foods, or fatty foods. Eat only light foods, such as toast or crackers. All liquids are okay except energy drinks and alcohol. 6 hours before your procedure Stop eating. Drink only clear liquids, such as water,  clear fruit juice, black coffee, plain tea, and sports drinks. Do not drink energy drinks or alcohol. 2 hours before your procedure Stop drinking all liquids. You may be allowed to take medicines with small sips of water. If you do not follow your health care provider's instructions, your procedure may be delayed or canceled. Medicines Ask your health care provider about: Changing or stopping your regular medicines. This is especially important if you are taking diabetes medicines or blood thinners. Taking medicines such as aspirin and ibuprofen. These medicines can thin your blood. Do not take these medicines unless your health care provider tells you to take them. Taking over-the-counter medicines, vitamins, herbs, and supplements. General instructions If you will be going home right after the procedure, plan to have a responsible adult: Take you home from the  hospital or clinic. You will not be allowed to drive. Care for you for the time you are told. What happens during the procedure?  An IV will be inserted into one of your veins. You may be given one or more of the following: A medicine to help you relax (sedative). A medicine to numb the throat (local anesthetic). You will lie on your left side on an exam table. Your health care provider will pass the endoscope through your mouth and down your esophagus. Your health care provider will use the scope to check the inside of your esophagus, stomach, and duodenum. Biopsies may be taken. The endoscope will be removed. The procedure may vary among health care providers and hospitals. What happens after the procedure? Your blood pressure, heart rate, breathing rate, and blood oxygen level will be monitored until you leave the hospital or clinic. When your throat is no longer numb, you may be given some fluids to drink. If you were given a sedative during the procedure, it can affect you for several hours. Do not drive or operate machinery  until your health care provider says that it is safe. It is up to you to get the results of your procedure. Ask your health care provider, or the department that is doing the procedure, when your results will be ready. Contact a health care provider if you: Have a sore throat that lasts longer than 1 day. Have a fever. Get help right away if you: Vomit blood or your vomit looks like coffee grounds. Have bloody, black, or tarry stools. Have a very bad sore throat or you cannot swallow. Have difficulty breathing or very bad pain in your chest or abdomen. These symptoms may be an emergency. Get help right away. Call 911. Do not wait to see if the symptoms will go away. Do not drive yourself to the hospital. Summary Upper endoscopy is a procedure to look inside the upper GI tract. During the procedure, an IV will be inserted into one of your veins. You may be given a medicine to help you relax. The endoscope will be passed through your mouth and down your esophagus. Follow instructions from your health care provider about what you can eat and drink. This information is not intended to replace advice given to you by your health care provider. Make sure you discuss any questions you have with your health care provider. Document Revised: 06/30/2021 Document Reviewed: 06/30/2021 Elsevier Patient Education  2023 [employer].

## 2021-12-14 ENCOUNTER — Other Ambulatory Visit (INDEPENDENT_AMBULATORY_CARE_PROVIDER_SITE_OTHER): Payer: Self-pay

## 2021-12-15 ENCOUNTER — Encounter (HOSPITAL_COMMUNITY): Payer: Self-pay | Admitting: Anesthesiology

## 2021-12-16 ENCOUNTER — Telehealth (INDEPENDENT_AMBULATORY_CARE_PROVIDER_SITE_OTHER): Payer: Self-pay

## 2021-12-16 ENCOUNTER — Ambulatory Visit (HOSPITAL_COMMUNITY): Admission: AD | Admit: 2021-12-16 | Payer: Medicare PPO | Source: Home / Self Care | Admitting: Gastroenterology

## 2021-12-16 ENCOUNTER — Encounter (HOSPITAL_COMMUNITY): Admission: AD | Payer: Self-pay | Source: Home / Self Care

## 2021-12-16 DIAGNOSIS — K219 Gastro-esophageal reflux disease without esophagitis: Secondary | ICD-10-CM

## 2021-12-16 DIAGNOSIS — Z1211 Encounter for screening for malignant neoplasm of colon: Secondary | ICD-10-CM

## 2021-12-16 SURGERY — COLONOSCOPY WITH PROPOFOL
Anesthesia: Monitor Anesthesia Care

## 2021-12-16 NOTE — Telephone Encounter (Signed)
Kathleen Mcpherson is just calling at 2:30 pm on 12/16/21 day of her procedure stating that her prep didn't agree with her she feel asleep only went to the bathroom twice so she decided just not to come or call till now and is asking about rescheduling her Tcs/Egd w/mac please advise?

## 2021-12-16 NOTE — Telephone Encounter (Signed)
Please let her know that she should call ahead of time to cancel. We can rescheduled her one THIRD time, but if she does not show up, we may not be able to continue taking care of her. Thanks

## 2022-01-06 ENCOUNTER — Encounter (INDEPENDENT_AMBULATORY_CARE_PROVIDER_SITE_OTHER): Payer: Self-pay

## 2022-01-06 ENCOUNTER — Other Ambulatory Visit (INDEPENDENT_AMBULATORY_CARE_PROVIDER_SITE_OTHER): Payer: Self-pay

## 2022-01-06 ENCOUNTER — Telehealth (INDEPENDENT_AMBULATORY_CARE_PROVIDER_SITE_OTHER): Payer: Self-pay

## 2022-01-06 NOTE — Telephone Encounter (Signed)
Graviel Payeur Ann Greer Wainright, CMA  ?

## 2022-01-06 NOTE — Telephone Encounter (Signed)
Kathleen Mcpherson Ann Johntay Doolen, CMA  ?

## 2022-01-07 NOTE — Patient Instructions (Signed)
Kathleen Mcpherson  01/07/2022     @PREFPERIOPPHARMACY @   Your procedure is scheduled on  01/13/2022.   Report to [facility] at  [place].[facility].   Call this number if you have problems the morning of surgery:  [phone]   Remember:  Follow the diet and prep instructions given to you by the office.     Take these medicines the morning of surgery with A SIP OF WATER                       buspar, prilosec, zofran (if needed).     Do not wear jewelry, make-up or nail polish.  Do not wear lotions, powders, or perfumes, or deodorant.  Do not shave 48 hours prior to surgery.  Men may shave face and neck.  Do not bring valuables to the hospital.  [facility] is not responsible for any belongings or valuables.  Contacts, dentures or bridgework may not be worn into surgery.  Leave your suitcase in the car.  After surgery it may be brought to your room.  For patients admitted to the hospital, discharge time will be determined by your treatment team.  Patients discharged the day of surgery will not be allowed to drive home and must have someone with them for 24 hours.    Special instructions:   DO NOT smoke tobacco or vape for 24 hours before your procedure.  Please read over the following fact sheets that you were given. Anesthesia Post-op Instructions and Care and Recovery After Surgery      Upper Endoscopy, Adult, Care After After the procedure, it is common to have a sore throat. It is also common to have: Mild stomach pain or discomfort. Bloating. Nausea. Follow these instructions at home: The instructions below may help you care for yourself at home. Your health care provider may give you more instructions. If you have questions, ask your health care provider. If you were given a sedative during the procedure, it can affect you for several hours. Do not drive or operate machinery until your health care provider says that it is safe. If you will be going home  right after the procedure, plan to have a responsible adult: Take you home from the hospital or clinic. You will not be allowed to drive. Care for you for the time you are told. Follow instructions from your health care provider about what you may eat and drink. Return to your normal activities as told by your health care provider. Ask your health care provider what activities are safe for you. Take over-the-counter and prescription medicines only as told by your health care provider. Contact a health care provider if you: Have a sore throat that lasts longer than one day. Have trouble swallowing. Have a fever. Get help right away if you: Vomit blood or your vomit looks like coffee grounds. Have bloody, black, or tarry stools. Have a very bad sore throat or you cannot swallow. Have difficulty breathing or very bad pain in your chest or abdomen. These symptoms may be an emergency. Get help right away. Call 911. Do not wait to see if the symptoms will go away. Do not drive yourself to the hospital. Summary After the procedure, it is common to have a sore throat, mild stomach discomfort, bloating, and nausea. If you were given a sedative during the procedure, it can affect you for several hours. Do not drive  until your health care provider says that it is safe. Follow instructions from your health care provider about what you may eat and drink. Return to your normal activities as told by your health care provider. This information is not intended to replace advice given to you by your health care provider. Make sure you discuss any questions you have with your health care provider. Document Revised: 06/30/2021 Document Reviewed: 06/30/2021 Elsevier Patient Education  2023 Else[employer] Inc. Colonoscopy, Adult, Care After The following information offers guidance on how to care for yourself after your procedure. Your health care provider may also give you more specific instructions. If you have  problems or questions, contact your health care provider. What can I expect after the procedure? After the procedure, it is common to have: A small amount of blood in your stool for 24 hours after the procedure. Some gas. Mild cramping or bloating of your abdomen. Follow these instructions at home: Eating and drinking  Drink enough fluid to keep your urine pale yellow. Follow instructions from your health care provider about eating or drinking restrictions. Resume your normal diet as told by your health care provider. Avoid heavy or fried foods that are hard to digest. Activity Rest as told by your health care provider. Avoid sitting for a long time without moving. Get up to take short walks every 1-2 hours. This is important to improve blood flow and breathing. Ask for help if you feel weak or unsteady. Return to your normal activities as told by your health care provider. Ask your health care provider what activities are safe for you. Managing cramping and bloating  Try walking around when you have cramps or feel bloated. If directed, apply heat to your abdomen as told by your health care provider. Use the heat source that your health care provider recommends, such as a moist heat pack or a heating pad. Place a towel between your skin and the heat source. Leave the heat on for 20-30 minutes. Remove the heat if your skin turns bright red. This is especially important if you are unable to feel pain, heat, or cold. You have a greater risk of getting burned. General instructions If you were given a sedative during the procedure, it can affect you for several hours. Do not drive or operate machinery until your health care provider says that it is safe. For the first 24 hours after the procedure: Do not sign important documents. Do not drink alcohol. Do your regular daily activities at a slower pace than normal. Eat soft foods that are easy to digest. Take over-the-counter and prescription  medicines only as told by your health care provider. Keep all follow-up visits. This is important. Contact a health care provider if: You have blood in your stool 2-3 days after the procedure. Get help right away if: You have more than a small spotting of blood in your stool. You have large blood clots in your stool. You have swelling of your abdomen. You have nausea or vomiting. You have a fever. You have increasing pain in your abdomen that is not relieved with medicine. These symptoms may be an emergency. Get help right away. Call 911. Do not wait to see if the symptoms will go away. Do not drive yourself to the hospital. Summary After the procedure, it is common to have a small amount of blood in your stool. You may also have mild cramping and bloating of your abdomen. If you were given a sedative during the procedure,  it can affect you for several hours. Do not drive or operate machinery until your health care provider says that it is safe. Get help right away if you have a lot of blood in your stool, nausea or vomiting, a fever, or increased pain in your abdomen. This information is not intended to replace advice given to you by your health care provider. Make sure you discuss any questions you have with your health care provider. Document Revised: 11/11/2020 Document Reviewed: 11/11/2020 Elsevier Patient Education  [place] After This sheet gives you information about how to care for yourself after your procedure. Your health care provider may also give you more specific instructions. If you have problems or questions, contact your health care provider. What can I expect after the procedure? After the procedure, it is common to have: Tiredness. Forgetfulness about what happened after the procedure. Impaired judgment for important decisions. Nausea or vomiting. Some difficulty with balance. Follow these instructions at home: For the time  period you were told by your health care provider:     Rest as needed. Do not participate in activities where you could fall or become injured. Do not drive or use machinery. Do not drink alcohol. Do not take sleeping pills or medicines that cause drowsiness. Do not make important decisions or sign legal documents. Do not take care of children on your own. Eating and drinking Follow the diet that is recommended by your health care provider. Drink enough fluid to keep your urine pale yellow. If you vomit: Drink water, juice, or soup when you can drink without vomiting. Make sure you have little or no nausea before eating solid foods. General instructions Have a responsible adult stay with you for the time you are told. It is important to have someone help care for you until you are awake and alert. Take over-the-counter and prescription medicines only as told by your health care provider. If you have sleep apnea, surgery and certain medicines can increase your risk for breathing problems. Follow instructions from your health care provider about wearing your sleep device: Anytime you are sleeping, including during daytime naps. While taking prescription pain medicines, sleeping medicines, or medicines that make you drowsy. Avoid smoking. Keep all follow-up visits as told by your health care provider. This is important. Contact a health care provider if: You keep feeling nauseous or you keep vomiting. You feel light-headed. You are still sleepy or having trouble with balance after 24 hours. You develop a rash. You have a fever. You have redness or swelling around the IV site. Get help right away if: You have trouble breathing. You have new-onset confusion at home. Summary For several hours after your procedure, you may feel tired. You may also be forgetful and have poor judgment. Have a responsible adult stay with you for the time you are told. It is important to have someone help  care for you until you are awake and alert. Rest as told. Do not drive or operate machinery. Do not drink alcohol or take sleeping pills. Get help right away if you have trouble breathing, or if you suddenly become confused. This information is not intended to replace advice given to you by your health care provider. Make sure you discuss any questions you have with your health care provider. Document Revised: 02/23/2021 Document Reviewed: 02/21/2019 Elsevier Patient Education  [place].

## 2022-01-11 ENCOUNTER — Encounter (HOSPITAL_COMMUNITY)
Admission: RE | Admit: 2022-01-11 | Discharge: 2022-01-11 | Disposition: A | Discharge status: Home / Self Care | Payer: Medicare HMO | Source: Ambulatory Visit | Attending: Gastroenterology | Admitting: Gastroenterology

## 2022-01-11 ENCOUNTER — Encounter (INDEPENDENT_AMBULATORY_CARE_PROVIDER_SITE_OTHER): Payer: Self-pay

## 2022-01-11 ENCOUNTER — Encounter (HOSPITAL_COMMUNITY): Payer: Self-pay

## 2022-01-11 DIAGNOSIS — Z01818 Encounter for other preprocedural examination: Secondary | ICD-10-CM

## 2022-01-13 ENCOUNTER — Ambulatory Visit (HOSPITAL_COMMUNITY): Admission: RE | Admit: 2022-01-13 | Payer: Medicare HMO | Source: Home / Self Care | Admitting: Gastroenterology

## 2022-01-13 ENCOUNTER — Encounter (HOSPITAL_COMMUNITY): Admission: RE | Payer: Self-pay | Source: Home / Self Care

## 2022-01-13 DIAGNOSIS — I080 Rheumatic disorders of both mitral and aortic valves: Secondary | ICD-10-CM

## 2022-01-13 DIAGNOSIS — Z01818 Encounter for other preprocedural examination: Secondary | ICD-10-CM

## 2022-01-13 DIAGNOSIS — Z720 Tobacco use: Secondary | ICD-10-CM

## 2022-01-13 SURGERY — COLONOSCOPY WITH PROPOFOL
Anesthesia: Monitor Anesthesia Care

## 2022-01-17 ENCOUNTER — Encounter (INDEPENDENT_AMBULATORY_CARE_PROVIDER_SITE_OTHER): Payer: Self-pay

## 2022-01-21 ENCOUNTER — Other Ambulatory Visit (INDEPENDENT_AMBULATORY_CARE_PROVIDER_SITE_OTHER): Payer: Self-pay

## 2022-02-17 ENCOUNTER — Ambulatory Visit (INDEPENDENT_AMBULATORY_CARE_PROVIDER_SITE_OTHER): Payer: Medicare HMO | Admitting: Gastroenterology

## 2022-02-22 ENCOUNTER — Encounter (HOSPITAL_COMMUNITY): Payer: Self-pay | Admitting: Anesthesiology

## 2022-02-23 ENCOUNTER — Encounter (HOSPITAL_COMMUNITY)
Admission: RE | Disposition: A | Discharge status: Home / Self Care | Payer: Self-pay | Source: Home / Self Care | Attending: Gastroenterology

## 2022-02-23 ENCOUNTER — Encounter (HOSPITAL_COMMUNITY): Payer: Self-pay | Admitting: Gastroenterology

## 2022-02-23 ENCOUNTER — Other Ambulatory Visit: Payer: Self-pay

## 2022-02-23 ENCOUNTER — Telehealth: Payer: Self-pay | Admitting: *Deleted

## 2022-02-23 ENCOUNTER — Ambulatory Visit (HOSPITAL_COMMUNITY)
Admission: RE | Admit: 2022-02-23 | Discharge: 2022-02-23 | Disposition: A | Discharge status: Home / Self Care | Payer: Medicare HMO | Attending: Gastroenterology | Admitting: Gastroenterology

## 2022-02-23 DIAGNOSIS — Z1211 Encounter for screening for malignant neoplasm of colon: Secondary | ICD-10-CM | POA: Insufficient documentation

## 2022-02-23 DIAGNOSIS — K219 Gastro-esophageal reflux disease without esophagitis: Secondary | ICD-10-CM | POA: Insufficient documentation

## 2022-02-23 DIAGNOSIS — Z538 Procedure and treatment not carried out for other reasons: Secondary | ICD-10-CM | POA: Diagnosis not present

## 2022-02-23 HISTORY — DX: Cardiac arrhythmia, unspecified: I49.9

## 2022-02-23 SURGERY — COLONOSCOPY WITH PROPOFOL
Anesthesia: Monitor Anesthesia Care

## 2022-02-23 MED ORDER — PEG 3350-KCL-NA BICARB-NACL 420 G PO SOLR: 4000.0000 mL | Freq: Once | ORAL | 0 refills | Status: AC

## 2022-02-23 MED ORDER — LACTATED RINGERS IV SOLN: INTRAVENOUS | Status: DC

## 2022-02-23 NOTE — OR Nursing (Signed)
Patient arrived for a TCS and EGD. Patient stated she was still passing formed hard stool. Patient was also chewing gum. Patient decided to reschedule both procedures for 11/30 and will arrive at 0815. Patient instructed to go to office to get new instructions. Patient verbalized understanding.

## 2022-02-23 NOTE — Telephone Encounter (Signed)
Received call from [place] [facility] in endo pt not prepped. She wants both procedures done at same time so she rebooked pt to 11/30 at 10am. Pt being sent over to get new instructions. Spoke with Dr. Levon Hedger and will give 2 day prep and she needs to make sure she does it all.

## 2022-02-23 NOTE — Telephone Encounter (Signed)
Pt came in. Have new instructions. She advised me to send prep rx to [facility] [place]. I have done so. Advised her to pick up now and hold on to it. Advised how important it is to follow instructions. She voiced understanding. Also hand wrote instructions no gum chewing for procedure

## 2022-02-28 ENCOUNTER — Encounter: Payer: Self-pay | Admitting: Internal Medicine

## 2022-02-28 ENCOUNTER — Ambulatory Visit (INDEPENDENT_AMBULATORY_CARE_PROVIDER_SITE_OTHER): Payer: Medicare HMO | Admitting: Internal Medicine

## 2022-02-28 VITALS — BP 137/69 | HR 97 | Resp 16 | Ht 61.5 in | Wt 155.0 lb

## 2022-02-28 DIAGNOSIS — R600 Localized edema: Secondary | ICD-10-CM | POA: Diagnosis not present

## 2022-02-28 DIAGNOSIS — J449 Chronic obstructive pulmonary disease, unspecified: Secondary | ICD-10-CM

## 2022-02-28 DIAGNOSIS — R7989 Other specified abnormal findings of blood chemistry: Secondary | ICD-10-CM

## 2022-02-28 DIAGNOSIS — Z13220 Encounter for screening for lipoid disorders: Secondary | ICD-10-CM

## 2022-02-28 DIAGNOSIS — Z1231 Encounter for screening mammogram for malignant neoplasm of breast: Secondary | ICD-10-CM | POA: Diagnosis not present

## 2022-02-28 DIAGNOSIS — K732 Chronic active hepatitis, not elsewhere classified: Secondary | ICD-10-CM

## 2022-02-28 DIAGNOSIS — D649 Anemia, unspecified: Secondary | ICD-10-CM

## 2022-02-28 DIAGNOSIS — L739 Follicular disorder, unspecified: Secondary | ICD-10-CM

## 2022-02-28 DIAGNOSIS — D62 Acute posthemorrhagic anemia: Secondary | ICD-10-CM

## 2022-02-28 DIAGNOSIS — F314 Bipolar disorder, current episode depressed, severe, without psychotic features: Secondary | ICD-10-CM

## 2022-02-28 DIAGNOSIS — E785 Hyperlipidemia, unspecified: Secondary | ICD-10-CM

## 2022-02-28 MED ORDER — OXYCODONE HCL 5 MG PO CAPS: 5.0000 mg | ORAL_CAPSULE | Freq: Two times a day (BID) | ORAL | 0 refills | Status: DC | PRN

## 2022-02-28 MED ORDER — ALBUTEROL SULFATE HFA 108 (90 BASE) MCG/ACT IN AERS: 2.0000 | INHALATION_SPRAY | Freq: Four times a day (QID) | RESPIRATORY_TRACT | 1 refills | Status: AC | PRN

## 2022-02-28 NOTE — Patient Instructions (Signed)
Thank you for trusting me with your care. To recap, today we discussed the following:  Test ordered: - CBC with Differential/Platelet - Fe+TIBC+Fer - BMP8+EGFR - ECHOCARDIOGRAM COMPLETE; Future - B Nat Peptide - Lipid panel  Referral placed: - Ambulatory referral to Psychiatry - MM DIGITAL SCREENING BILATERAL; Future  Follow up in 1 week for depression and pap smear.

## 2022-02-28 NOTE — Progress Notes (Unsigned)
CC: Establish Care and Leg Swelling (Pain and swelling in legs and feet. Chronic problem. Has been wearing compression socks. States she has a lot of fluid)   HPI:Kathleen Mcpherson is a 50 y.o. female who presents for evaluation of leg swelling and to establish care. For the details of today's visit, please refer to the assessment and plan.  Past Medical History:  Diagnosis Date   Addison disease ([place])    Anxiety    COPD (chronic obstructive pulmonary disease) (HCC)    Depression    Dysrhythmia    Fibromyalgia    Hepatitis C    PONV (postoperative nausea and vomiting)     Past Surgical History:  Procedure Laterality Date   CHOLECYSTECTOMY     ESOPHAGOGASTRODUODENOSCOPY  11/23/2011   erosive/ulcerative reflux esophagitis, bulbar duodenitis.   ESOPHAGOGASTRODUODENOSCOPY (EGD) WITH PROPOFOL N/A 01/11/2021   Procedure: ESOPHAGOGASTRODUODENOSCOPY (EGD) WITH PROPOFOL;  Surgeon: Eloise Harman, DO;  Location: [facility];  Service: Endoscopy;  Laterality: N/A;  possible dilation   INCISION AND DRAINAGE ABSCESS Left 05/31/2015   Procedure: INCISION AND DRAINAGE ABSCESS left foot;  Surgeon: Carole Civil, MD;  Location: [facility] ORS;  Service: Orthopedics;  Laterality: Left;   ORTHOPEDIC SURGERY     knee surgery   TUBAL LIGATION     TYMPANOSTOMY TUBE PLACEMENT      Family History  Problem Relation Age of Onset   Cancer Mother    Heart failure Sister    Heart failure Paternal Grandmother    Colon cancer Neg Hx     Social History   Tobacco Use   Smoking status: Every Day    Packs/day: 0.50    Types: Cigarettes   Smokeless tobacco: Never   Tobacco comments:    1 pack a day.  Vaping Use   Vaping Use: Former  Substance Use Topics   Alcohol use: No   Drug use: No    Comment: In the past, she smoke cocaine and marijuana. No IV drugs.    Review of Systems:    ROS   Physical Exam: Vitals:   02/28/22 1530  BP: 137/69  Pulse: 97  Resp: 16  SpO2: 96%   Weight: 155 lb (70.3 kg)  Height: 5' 1.5" (1.562 m)     Physical Exam Constitutional:      General: She is not in acute distress.    Comments: Sad appearing middle age female , hunched over  Cardiovascular:     Rate and Rhythm: Normal rate and regular rhythm.  Pulmonary:     Effort: Pulmonary effort is normal.     Breath sounds: Wheezing present. No rales.  Skin:    Findings: Rash (Great toe) present.  Psychiatric:        Attention and Perception: Attention normal. She does not perceive auditory or visual hallucinations.        Mood and Affect: Mood is depressed. Affect is flat.        Speech: Speech normal.        Behavior: Behavior is cooperative.        Thought Content: Thought content normal.        Cognition and Memory: Cognition normal.   Right toe below. Purplish colored macule with multiple overlying papules, well demarcated, with scaling of a few papules. Same appearance of left toe.   Assessment & Plan:   Bipolar disorder ([place]) Chronic problem. She is off of medications. Has been on many different treatments and reports  multiple failures of medications.  Referral to psychiatry    Bilateral lower extremity edema This is a chronic problem, worsened recently. Improves with compression stocking and elevation. She denies orthopnea. She has a 3/6 systolic murmur heard best at apex. Patient will continue compression stockings and elevating legs. Further workup as below.  - BMP8+EGFR - ECHOCARDIOGRAM COMPLETE; Future - B Nat Peptide  HYPERLIPIDEMIA Hx of hyperlipidemia on problem list. Not currently on medication.  -Lipid Panel  Folliculitis Rash on great toes. She has tried fungal spray without resolution. Patient has what appears to be folliculitis on great toes.  - mupirocin ointment (BACTROBAN) 2 %; Apply 1 Application topically 2 (two) times daily.  Dispense: 22 g; Refill: 0 - Follow up if not improved with treatment  COPD (chronic obstructive pulmonary  disease) (HCC) No recent PFT's. Smokes 1/2 ppd now. Started smoking as teenager and average 1ppd until recently. Currently out of inhalers. -Counseled on smoking cessation -Breztri sample and prescribe albuterol - PFT's  Anemia Presented last year to hospital with hematemesis. EGD showed esophagitis and no active bleed.Following with GI. She has needed to be reschedule multiple time for repeat EGD. Scheduled this week for EGD/Colonoscopy. On omeprazole BID. Reports taking iron. No hematemesis or melena.  - CBC - Iron Studies.    Kathleen Dy, MD

## 2022-03-01 DIAGNOSIS — L739 Follicular disorder, unspecified: Secondary | ICD-10-CM | POA: Insufficient documentation

## 2022-03-01 DIAGNOSIS — F319 Bipolar disorder, unspecified: Secondary | ICD-10-CM | POA: Insufficient documentation

## 2022-03-01 DIAGNOSIS — R600 Localized edema: Secondary | ICD-10-CM | POA: Insufficient documentation

## 2022-03-01 DIAGNOSIS — K732 Chronic active hepatitis, not elsewhere classified: Secondary | ICD-10-CM | POA: Insufficient documentation

## 2022-03-01 MED ORDER — MUPIROCIN 2 % EX OINT: 1.0000 | TOPICAL_OINTMENT | Freq: Two times a day (BID) | CUTANEOUS | 0 refills | Status: AC

## 2022-03-01 NOTE — Assessment & Plan Note (Signed)
No recent PFT's. Smokes 1/2 ppd now. Started smoking as teenager and average 1ppd until recently. Currently out of inhalers. -Counseled on smoking cessation -Breztri sample and prescribe albuterol - PFT's

## 2022-03-01 NOTE — Assessment & Plan Note (Signed)
This is a chronic problem, worsened recently. Improves with compression stocking and elevation. She denies orthopnea. She has a 3/6 systolic murmur heard best at apex. Patient will continue compression stockings and elevating legs. Further workup as below.  - BMP8+EGFR - ECHOCARDIOGRAM COMPLETE; Future - B Nat Peptide

## 2022-03-01 NOTE — Assessment & Plan Note (Signed)
Chronic problem. She is off of medications. Has been on many different treatments and reports multiple failures of medications.  Referral to psychiatry

## 2022-03-01 NOTE — Assessment & Plan Note (Signed)
Rash on great toes. She has tried fungal spray without resolution. Patient has what appears to be folliculitis on great toes.  - mupirocin ointment (BACTROBAN) 2 %; Apply 1 Application topically 2 (two) times daily.  Dispense: 22 g; Refill: 0 - Follow up if not improved with treatment

## 2022-03-01 NOTE — Assessment & Plan Note (Signed)
Hx of hyperlipidemia on problem list. Not currently on medication.  -Lipid Panel

## 2022-03-01 NOTE — Assessment & Plan Note (Addendum)
Presented last year to hospital with hematemesis. EGD showed esophagitis and no active bleed.Following with GI. She has needed to be reschedule multiple time for repeat EGD. Scheduled this week for EGD/Colonoscopy. On omeprazole BID. Reports taking iron. No hematemesis or melena.  - CBC - Iron Studies.

## 2022-03-03 ENCOUNTER — Ambulatory Visit (HOSPITAL_COMMUNITY): Payer: Medicare HMO | Admitting: Anesthesiology

## 2022-03-03 ENCOUNTER — Ambulatory Visit (HOSPITAL_BASED_OUTPATIENT_CLINIC_OR_DEPARTMENT_OTHER): Payer: Medicare HMO | Admitting: Anesthesiology

## 2022-03-03 ENCOUNTER — Encounter (HOSPITAL_COMMUNITY)
Admission: RE | Disposition: A | Discharge status: Home / Self Care | Payer: Self-pay | Source: Home / Self Care | Attending: Gastroenterology

## 2022-03-03 ENCOUNTER — Ambulatory Visit (HOSPITAL_COMMUNITY)
Admission: RE | Admit: 2022-03-03 | Discharge: 2022-03-03 | Disposition: A | Discharge status: Home / Self Care | Payer: Medicare HMO | Attending: Gastroenterology | Admitting: Gastroenterology

## 2022-03-03 ENCOUNTER — Encounter (HOSPITAL_COMMUNITY): Payer: Self-pay | Admitting: Gastroenterology

## 2022-03-03 ENCOUNTER — Other Ambulatory Visit: Payer: Self-pay

## 2022-03-03 DIAGNOSIS — K2980 Duodenitis without bleeding: Secondary | ICD-10-CM

## 2022-03-03 DIAGNOSIS — K31A19 Gastric intestinal metaplasia without dysplasia, unspecified site: Secondary | ICD-10-CM | POA: Insufficient documentation

## 2022-03-03 DIAGNOSIS — E271 Primary adrenocortical insufficiency: Secondary | ICD-10-CM | POA: Diagnosis not present

## 2022-03-03 DIAGNOSIS — Z1211 Encounter for screening for malignant neoplasm of colon: Secondary | ICD-10-CM

## 2022-03-03 DIAGNOSIS — Z79899 Other long term (current) drug therapy: Secondary | ICD-10-CM | POA: Insufficient documentation

## 2022-03-03 DIAGNOSIS — K219 Gastro-esophageal reflux disease without esophagitis: Secondary | ICD-10-CM | POA: Diagnosis not present

## 2022-03-03 DIAGNOSIS — R400 Somnolence: Secondary | ICD-10-CM | POA: Diagnosis not present

## 2022-03-03 DIAGNOSIS — J449 Chronic obstructive pulmonary disease, unspecified: Secondary | ICD-10-CM

## 2022-03-03 DIAGNOSIS — K449 Diaphragmatic hernia without obstruction or gangrene: Secondary | ICD-10-CM | POA: Insufficient documentation

## 2022-03-03 DIAGNOSIS — K2100 Gastro-esophageal reflux disease with esophagitis, without bleeding: Secondary | ICD-10-CM

## 2022-03-03 DIAGNOSIS — B1920 Unspecified viral hepatitis C without hepatic coma: Secondary | ICD-10-CM | POA: Insufficient documentation

## 2022-03-03 HISTORY — PX: ESOPHAGOGASTRODUODENOSCOPY (EGD) WITH PROPOFOL: SHX5813

## 2022-03-03 HISTORY — PX: BIOPSY: SHX5522

## 2022-03-03 LAB — CBC WITH DIFFERENTIAL/PLATELET
Basophils Absolute: 0.1 10*3/uL (ref 0.0–0.2)
Basos: 1 %
EOS (ABSOLUTE): 0 10*3/uL (ref 0.0–0.4)
Eos: 0 %
Hematocrit: 33.9 % — ABNORMAL LOW (ref 34.0–46.6)
Hemoglobin: 11.4 g/dL (ref 11.1–15.9)
Immature Grans (Abs): 0 10*3/uL (ref 0.0–0.1)
Immature Granulocytes: 0 %
Lymphocytes Absolute: 2 10*3/uL (ref 0.7–3.1)
Lymphs: 40 %
MCH: 26 pg — ABNORMAL LOW (ref 26.6–33.0)
MCHC: 33.6 g/dL (ref 31.5–35.7)
MCV: 77 fL — ABNORMAL LOW (ref 79–97)
Monocytes Absolute: 0.4 10*3/uL (ref 0.1–0.9)
Monocytes: 7 %
Neutrophils Absolute: 2.5 10*3/uL (ref 1.4–7.0)
Neutrophils: 52 %
Platelets: 160 10*3/uL (ref 150–450)
RBC: 4.38 x10E6/uL (ref 3.77–5.28)
RDW: 14.8 % (ref 11.7–15.4)
WBC: 5 10*3/uL (ref 3.4–10.8)

## 2022-03-03 LAB — BMP8+EGFR
BUN/Creatinine Ratio: 16 (ref 9–23)
BUN: 12 mg/dL (ref 6–24)
CO2: 25 mmol/L (ref 20–29)
Calcium: 9 mg/dL (ref 8.7–10.2)
Chloride: 102 mmol/L (ref 96–106)
Creatinine, Ser: 0.76 mg/dL (ref 0.57–1.00)
Glucose: 99 mg/dL (ref 70–99)
Potassium: 4.3 mmol/L (ref 3.5–5.2)
Sodium: 140 mmol/L (ref 134–144)
eGFR: 95 mL/min/{1.73_m2} (ref >59)

## 2022-03-03 LAB — LIPID PANEL
Chol/HDL Ratio: 2.6 ratio (ref 0.0–4.4)
Cholesterol, Total: 149 mg/dL (ref 100–199)
HDL: 57 mg/dL (ref >39)
LDL Chol Calc (NIH): 79 mg/dL (ref 0–99)
Triglycerides: 62 mg/dL (ref 0–149)
VLDL Cholesterol Cal: 13 mg/dL (ref 5–40)

## 2022-03-03 LAB — IRON,TIBC AND FERRITIN PANEL
Ferritin: 19 ng/mL (ref 15–150)
Iron Saturation: 13 % — ABNORMAL LOW (ref 15–55)
Iron: 57 ug/dL (ref 27–159)
Total Iron Binding Capacity: 448 ug/dL (ref 250–450)
UIBC: 391 ug/dL (ref 131–425)

## 2022-03-03 LAB — BRAIN NATRIURETIC PEPTIDE: BNP: 50.5 pg/mL (ref 0.0–100.0)

## 2022-03-03 SURGERY — ESOPHAGOGASTRODUODENOSCOPY (EGD) WITH PROPOFOL
Anesthesia: General

## 2022-03-03 MED ORDER — LACTATED RINGERS IV SOLN: INTRAVENOUS | Status: DC

## 2022-03-03 MED ORDER — PROPOFOL 10 MG/ML IV BOLUS
INTRAVENOUS | Status: DC | PRN
  Administered 2022-03-03: 50 mg via INTRAVENOUS
  Administered 2022-03-03: 100 mg via INTRAVENOUS
  Administered 2022-03-03: 50 mg via INTRAVENOUS

## 2022-03-03 MED ORDER — ESOMEPRAZOLE MAGNESIUM 40 MG PO CPDR: 40.0000 mg | DELAYED_RELEASE_CAPSULE | Freq: Two times a day (BID) | ORAL | 1 refills | Status: AC

## 2022-03-03 MED ORDER — SUCRALFATE 1 G PO TABS: 1.0000 g | ORAL_TABLET | Freq: Three times a day (TID) | ORAL | 1 refills | Status: AC

## 2022-03-03 MED ORDER — LIDOCAINE HCL (CARDIAC) PF 100 MG/5ML IV SOSY
PREFILLED_SYRINGE | INTRAVENOUS | Status: DC | PRN
  Administered 2022-03-03: 100 mg via INTRAVENOUS

## 2022-03-03 NOTE — Op Note (Addendum)
[facility] Patient Name: Kathleen Mcpherson Procedure Date: 03/03/2022 9:17 AM MRN: [id number] Date of Birth: 1971-02-07 Attending MD: Katrinka Blazing , , [id number] CSN: [id number] Age: 51 Admit Type: Outpatient Procedure:                Upper GI endoscopy Indications:              Follow-up of gastro-esophageal reflux disease Providers:                Katrinka Blazing, Sheran Fava, Kristine L.                            Jessee Avers, Technician Referring MD:              Medicines:                Monitored Anesthesia Care Complications:            No immediate complications. Estimated Blood Loss:     Estimated blood loss: none. Procedure:                Pre-Anesthesia Assessment:                           - Prior to the procedure, a History and Physical                            was performed, and patient medications, allergies                            and sensitivities were reviewed. The patient's                            tolerance of previous anesthesia was reviewed.                           - The risks and benefits of the procedure and the                            sedation options and risks were discussed with the                            patient. All questions were answered and informed                            consent was obtained.                           - ASA Grade Assessment: III - A patient with severe                            systemic disease.                           After obtaining informed consent, the endoscope was                            passed under direct vision. Throughout the  procedure, the patient's blood pressure, pulse, and                            oxygen saturations were monitored continuously. The                            [id number] scope was introduced through the                            mouth, and advanced to the second part of duodenum.                            The upper GI endoscopy  was accomplished without                            difficulty. The patient tolerated the procedure                            well. Scope In: 9:34:48 AM Scope Out: 9:41:35 AM Total Procedure Duration: 0 hours 6 minutes 47 seconds  Findings:      LA Grade D (one or more mucosal breaks involving at least 75% of       esophageal circumference) esophagitis with no bleeding was found 25 to       32 cm from the incisors.      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 The hiatal narrowing was 37 cm       from the incisors. The Z-line was 32 cm from the incisors.      The entire examined stomach was normal. Biopsies were taken with a cold       forceps for Helicobacter pylori testing.      Patchy mild inflammation characterized by congestion (edema) and       erythema was found in the first portion of the duodenum. Impression:               - LA Grade D reflux esophagitis with no bleeding.                           - 5 cm paraesophageal hernia.                           - Normal stomach. Biopsied.                           - Duodenitis. Moderate Sedation:      Per Anesthesia Care Recommendation:           - Discharge patient to home (ambulatory).                           - Resume previous diet.                           - Await pathology results.                           - Stop omeprazole, start esomeprazole 40 mg twice a  day.                           - Take sucralfate 1 g every 6 hours for one month.                           - Repeat upper endoscopy in 2 months for                            surveillance.                           - No aspirin, ibuprofen, naproxen, or other                            non-steroidal anti-inflammatory drugs. Procedure Code(s):        --- Professional ---                           [phone], Esophagogastroduodenoscopy, flexible,                            transoral; with biopsy, single or multiple Diagnosis Code(s):        ---  Professional ---                           K21.00, Gastro-esophageal reflux disease with                            esophagitis, without bleeding                           K44.9, Diaphragmatic hernia without obstruction or                            gangrene                           K29.80, Duodenitis without bleeding CPT copyright 2022 American Medical Association. All rights reserved. The codes documented in this report are preliminary and upon coder review may  be revised to meet current compliance requirements. Maylon Peppers, MD Maylon Peppers,  03/03/2022 9:51:48 AM This report has been signed electronically. Number of Addenda: 0

## 2022-03-03 NOTE — Anesthesia Postprocedure Evaluation (Signed)
Anesthesia Post Note  Patient: Kathleen Mcpherson  Procedure(s) Performed: ESOPHAGOGASTRODUODENOSCOPY (EGD) WITH PROPOFOL BIOPSY  Patient location during evaluation: Phase II Anesthesia Type: General Level of consciousness: awake and alert and oriented Pain management: pain level controlled Vital Signs Assessment: post-procedure vital signs reviewed and stable Respiratory status: spontaneous breathing, nonlabored ventilation and respiratory function stable Cardiovascular status: blood pressure returned to baseline and stable Postop Assessment: no apparent nausea or vomiting Anesthetic complications: no  No notable events documented.   Last Vitals:  Vitals:   03/03/22 0954 03/03/22 0957  BP: 107/71   Pulse: 99   Resp: 16 17  Temp:    SpO2: 100%     Last Pain:  Vitals:   03/03/22 0954  TempSrc:   PainSc: 0-No pain                 Dominick Morella C Zavier Canela

## 2022-03-03 NOTE — H&P (Signed)
Kathleen Mcpherson is an 51 y.o. female.   Chief Complaint: gastroesophageal reflux HPI: Kathleen Mcpherson is a 51 y.o. female with PMH hepatitis C genotype 2B, COPD, Addison's disease, GERD, who presents for  GERD.   Patient denies any complaints although she is a poor historian, is very somnolent today.  States taking omeprazole compliantly.  She did not take her bowel prep today.  Past Medical History:  Diagnosis Date   Addison disease (HCC)    Anxiety    COPD (chronic obstructive pulmonary disease) (HCC)    Depression    Dysrhythmia    Fibromyalgia    Hepatitis C    PONV (postoperative nausea and vomiting)     Past Surgical History:  Procedure Laterality Date   CHOLECYSTECTOMY     ESOPHAGOGASTRODUODENOSCOPY  11/23/2011   erosive/ulcerative reflux esophagitis, bulbar duodenitis.   ESOPHAGOGASTRODUODENOSCOPY (EGD) WITH PROPOFOL N/A 01/11/2021   Procedure: ESOPHAGOGASTRODUODENOSCOPY (EGD) WITH PROPOFOL;  Surgeon: Lanelle Bal, DO;  Location: [facility];  Service: Endoscopy;  Laterality: N/A;  possible dilation   INCISION AND DRAINAGE ABSCESS Left 05/31/2015   Procedure: INCISION AND DRAINAGE ABSCESS left foot;  Surgeon: Vickki Hearing, MD;  Location: [facility] ORS;  Service: Orthopedics;  Laterality: Left;   ORTHOPEDIC SURGERY     knee surgery   TUBAL LIGATION     TYMPANOSTOMY TUBE PLACEMENT      Family History  Problem Relation Age of Onset   Cancer Mother    Heart failure Sister    Heart failure Paternal Grandmother    Colon cancer Neg Hx    Social History:  reports that she has been smoking cigarettes. She has been smoking an average of .5 packs per day. She has never used smokeless tobacco. She reports that she does not drink alcohol and does not use drugs.  Allergies:  Allergies  Allergen Reactions   Asa [Aspirin] Other (See Comments)    G.I.Upset   Prednisone Itching   Tape     sore   Tylenol [Acetaminophen] Other (See Comments)    G.I. Upset     Medications Prior to Admission  Medication Sig Dispense Refill   ondansetron (ZOFRAN) 4 MG tablet Take 1 tablet (4 mg total) by mouth every 8 (eight) hours as needed for nausea or vomiting. 30 tablet 2   polyethylene glycol-electrolytes (NULYTELY) 420 g solution Take 4,000 mLs by mouth once.     albuterol (VENTOLIN HFA) 108 (90 Base) MCG/ACT inhaler Inhale 2 puffs into the lungs every 6 (six) hours as needed for wheezing or shortness of breath. 8 g 1   fexofenadine (ALLEGRA) 180 MG tablet Take 180 mg by mouth daily as needed for allergies or rhinitis. (Patient not taking: Reported on 02/28/2022)     mupirocin ointment (BACTROBAN) 2 % Apply 1 Application topically 2 (two) times daily. 22 g 0   omeprazole (PRILOSEC) 40 MG capsule Take 1 capsule (40 mg total) by mouth 2 (two) times daily. 180 capsule 3    No results found for this or any previous visit (from the past 48 hour(s)). No results found.  Review of Systems  Blood pressure (!) 130/52, pulse 73, temperature 97.6 F (36.4 C), temperature source Oral, resp. rate 13, height 5' 1.5" (1.562 m), weight 70.3 kg, SpO2 99 %. Physical Exam  GENERAL: The patient is AO x3, in no acute distress. HEENT: Head is normocephalic and atraumatic. EOMI are intact. Mouth is well hydrated and without lesions. NECK: Supple. No masses LUNGS: Clear  to auscultation. No presence of rhonchi/wheezing/rales. Adequate chest expansion HEART: RRR, normal s1 and s2. ABDOMEN: Soft, nontender, no guarding, no peritoneal signs, and nondistended. BS +. No masses. EXTREMITIES: Without any cyanosis, clubbing, rash, lesions or edema. NEUROLOGIC: AOx3, no focal motor deficit. SKIN: no jaundice, no rashes  Assessment/Plan Kathleen Mcpherson is a 51 y.o. female with PMH hepatitis C genotype 2B, COPD, Addison's disease, GERD, who presents for  GERD.  Will proceed with EGD. She did not take her bowel prep today.  Dolores Frame, MD 03/03/2022, 9:05 AM

## 2022-03-03 NOTE — Anesthesia Preprocedure Evaluation (Addendum)
Anesthesia Evaluation  Patient identified by MRN, date of birth, ID band Patient awake    Reviewed: Allergy & Precautions, H&P , NPO status , Patient's Chart, lab work & pertinent test results  History of Anesthesia Complications (+) PONV and history of anesthetic complications  Airway Mallampati: II  TM Distance: >3 FB Neck ROM: Full    Dental  (+) Dental Advisory Given, Missing, Chipped   Pulmonary COPD,  COPD inhaler, Current SmokerPatient did not abstain from smoking.   Pulmonary exam normal breath sounds clear to auscultation       Cardiovascular Exercise Tolerance: Poor METS (bilateral LE pain): + dysrhythmias + Valvular Problems/Murmurs MR  Rhythm:Regular Rate:Normal + Systolic murmurs    Neuro/Psych  PSYCHIATRIC DISORDERS Anxiety Depression Bipolar Disorder    Neuromuscular disease    GI/Hepatic ,GERD  Medicated,,(+) Hepatitis -, C  Endo/Other  negative endocrine ROS    Renal/GU negative Renal ROS  negative genitourinary   Musculoskeletal  (+)  Fibromyalgia -  Abdominal   Peds negative pediatric ROS (+)  Hematology  (+) Blood dyscrasia, anemia   Anesthesia Other Findings Addison's disease - resolved, not on meds Bilateral LE edema  Reproductive/Obstetrics negative OB ROS                             Anesthesia Physical Anesthesia Plan  ASA: 3  Anesthesia Plan: General   Post-op Pain Management: Minimal or no pain anticipated   Induction: Intravenous  PONV Risk Score and Plan: 1 and Propofol infusion  Airway Management Planned: Nasal Cannula, Natural Airway and Simple Face Mask  Additional Equipment:   Intra-op Plan:   Post-operative Plan:   Informed Consent: I have reviewed the patients History and Physical, chart, labs and discussed the procedure including the risks, benefits and alternatives for the proposed anesthesia with the patient or authorized representative  who has indicated his/her understanding and acceptance.     Dental advisory given  Plan Discussed with: CRNA and Surgeon  Anesthesia Plan Comments:        Anesthesia Quick Evaluation

## 2022-03-03 NOTE — Transfer of Care (Signed)
Immediate Anesthesia Transfer of Care Note  Patient: Kathleen Mcpherson  Procedure(s) Performed: ESOPHAGOGASTRODUODENOSCOPY (EGD) WITH PROPOFOL BIOPSY  Patient Location: Endoscopy Unit  Anesthesia Type:General  Level of Consciousness: drowsy  Airway & Oxygen Therapy: Patient Spontanous Breathing  Post-op Assessment: Report given to RN and Post -op Vital signs reviewed and stable  Post vital signs: Reviewed and stable  Last Vitals:  Vitals Value Taken Time  BP 99/78 03/03/22 0946  Temp 36.4 C 03/03/22 0946  Pulse 98 03/03/22 0947  Resp 17 03/03/22 0947  SpO2 100 % 03/03/22 0947  Vitals shown include unvalidated device data.  Last Pain:  Vitals:   03/03/22 0946  TempSrc: Oral  PainSc:       Patients Stated Pain Goal: 4 (03/03/22 0835)  Complications: No notable events documented.

## 2022-03-03 NOTE — Discharge Instructions (Signed)
You are being discharged to home.  Resume your previous diet.  We are waiting for your pathology results.  Your physician has recommended a repeat upper endoscopy in two months for surveillance.  Stop omeprazole, start esomeprazole 40 mg twice a day. Take sucralfate 1 g every 6 hours for one month.

## 2022-03-04 LAB — SURGICAL PATHOLOGY

## 2022-03-07 ENCOUNTER — Ambulatory Visit (INDEPENDENT_AMBULATORY_CARE_PROVIDER_SITE_OTHER): Payer: Medicare HMO | Admitting: Internal Medicine

## 2022-03-07 ENCOUNTER — Encounter: Payer: Self-pay | Admitting: Internal Medicine

## 2022-03-07 VITALS — BP 138/84 | HR 90 | Resp 16 | Ht 61.5 in | Wt 156.0 lb

## 2022-03-07 DIAGNOSIS — F314 Bipolar disorder, current episode depressed, severe, without psychotic features: Secondary | ICD-10-CM

## 2022-03-07 DIAGNOSIS — R600 Localized edema: Secondary | ICD-10-CM

## 2022-03-07 NOTE — Assessment & Plan Note (Signed)
She has not had appointment with psychiatry yet. She is in agreement to wait on starting therapy until meeting with psychiatry given complicated past with medications. No SI or HI.

## 2022-03-07 NOTE — Progress Notes (Unsigned)
     CC: bilateral foot     HPI:Ms.Kathleen Mcpherson is a 51 y.o. female who presents for original plan of having pap smear, but would like a female provider. She had endoscopy since last visit and showed intestinal metaplasia. Dr.Mayorga unable to reach patient, and I was unable to reach patient last week. She does not have cell phone surface in her basement and a home phone number was added to chart today. She continues to have depression and difficulty focusing. She is jumping from one topic to another. A referral was placed for psychiatry last week and she is aware she will be contacted. No SI or HI. She continues to have bilateral foot swelling. She is not elevating her legs. BNP was normal. Iron is low, but patient is not anemic. Discussed this is likely related to intestinal metaplasia and will monitor for improvement with treatment of this condition.  Past Medical History:  Diagnosis Date   Addison disease (HCC)    Anxiety    COPD (chronic obstructive pulmonary disease) (HCC)    Depression    Dysrhythmia    Fibromyalgia    Hepatitis C    PONV (postoperative nausea and vomiting)      Physical Exam: Vitals:   03/07/22 1442 03/07/22 1520  BP: (!) 155/78 138/84  Pulse: 90   Resp: 16   SpO2: 97%   Weight: 156 lb (70.8 kg)   Height: 5' 1.5" (1.562 m)      Physical Exam Constitutional:      General: She is not in acute distress.    Appearance: She is not ill-appearing.  Cardiovascular:     Rate and Rhythm: Normal rate and regular rhythm.     Heart sounds: Murmur (3/6 systolic, heard best at apex) heard.  Musculoskeletal:     Right lower leg: Edema (1+) present.     Left lower leg: Edema (1+) present.  Psychiatric:        Mood and Affect: Affect normal. Mood is depressed.        Speech: Speech is tangential.        Behavior: Behavior normal.        Thought Content: Thought content does not include suicidal ideation. Thought content does not include homicidal plan.         Cognition and Memory: Cognition normal.      Assessment & Plan:   Bilateral lower extremity edema Patient with 1+ edema. She has not been elevating legs this week , but continues to wear stockings. Lungs are clear. Waiting on echocardiogram for further evaluation of heart murmur. - Continue compression and elevation of legs.   Bipolar disorder [facility]) She has not had appointment with psychiatry yet. She is in agreement to wait on starting therapy until meeting with psychiatry given complicated past with medications. No SI or HI.     Milus Banister, MD

## 2022-03-07 NOTE — Patient Instructions (Addendum)
Thank you for trusting me with your care. To recap, today we discussed the following:   Swelling in your feet - Continue compression stockings - Elevate your legs at night   - Continue medication you have been prescribed and follow up for endoscopy in 2 months.

## 2022-03-07 NOTE — Assessment & Plan Note (Signed)
Patient with 1+ edema. She has not been elevating legs this week , but continues to wear stockings. Lungs are clear. Waiting on echocardiogram for further evaluation of heart murmur. - Continue compression and elevation of legs.

## 2022-03-08 ENCOUNTER — Encounter (HOSPITAL_COMMUNITY): Payer: Self-pay | Admitting: Gastroenterology

## 2022-03-21 ENCOUNTER — Encounter (INDEPENDENT_AMBULATORY_CARE_PROVIDER_SITE_OTHER): Payer: Self-pay | Admitting: Gastroenterology

## 2022-03-21 ENCOUNTER — Ambulatory Visit (INDEPENDENT_AMBULATORY_CARE_PROVIDER_SITE_OTHER): Payer: Medicare HMO | Admitting: Gastroenterology

## 2022-03-21 VITALS — BP 124/76 | HR 105 | Temp 97.8°F | Ht 61.5 in | Wt 156.1 lb

## 2022-03-21 DIAGNOSIS — Z1212 Encounter for screening for malignant neoplasm of rectum: Secondary | ICD-10-CM

## 2022-03-21 DIAGNOSIS — B1920 Unspecified viral hepatitis C without hepatic coma: Secondary | ICD-10-CM | POA: Diagnosis not present

## 2022-03-21 DIAGNOSIS — Z1211 Encounter for screening for malignant neoplasm of colon: Secondary | ICD-10-CM | POA: Diagnosis not present

## 2022-03-21 DIAGNOSIS — K2100 Gastro-esophageal reflux disease with esophagitis, without bleeding: Secondary | ICD-10-CM | POA: Diagnosis not present

## 2022-03-21 NOTE — Patient Instructions (Addendum)
Continue esomeprazole 40 mg twice a day Continue with Zofran as needed Will refer to [facility] for hiatal hernia repair and possible Nissen fundoplication Will discuss performing elastography in next appointment for evaluation of hepatitis C - treatment options limited due to medication interaction but this may be easier after hiatal hernia correction  Proceed with Cologuard

## 2022-03-21 NOTE — Progress Notes (Unsigned)
Kathleen Mcpherson, M.D. Gastroenterology & Hepatology [facility] Gastroenterology [address] [place], [place] [address]  Primary Care Physician: Gardenia Phlegm, MD [address] [place] [address]  I will communicate my assessment and recommendations to the referring MD via EMR.  Problems: Large hiatal hernia Grade D esophagitis History of hepatitis C genotype 2B  History of Present Illness: Kathleen Mcpherson is a 51 y.o. female with PMH hepatitis C genotype 2B, COPD, Addison's disease, GERD, who presents for follow-up of hepatitis C and esophagitis.   The patient was last seen on 11/15/2021. At that time, she was started on omeprazole 40 mg twice a day.  The patient was scheduled for EGD and colonoscopy.  However she only proceed with EGD as she reported she could not tolerate the bowel prep.  Findings of esophagogastroduodenospy described below, found to have large hernia and grade D esophagitis which he has been managed with Nexium 40 mg twice a day.  Patient reports that she has felt relatively well. States that as long as she takes her medication, she does not have heartburn. No odynophagia or dysphagia. States sometimes she misses her medicine and has worsening heartburn. States that sometimes she has a squeezing pain in the middle of her chest.  She reports she has frequent nausea, which gets better when she takes Zofran. May take max of 2 pills per day.  The patient denies having any dysphagia, odynophagia, fever, chills, hematochezia, melena, hematemesis, abdominal distention, diarrhea, jaundice, pruritus or weight loss.  Last EGD:03/03/2022 - LA Grade D reflux esophagitis with no bleeding. - 5 cm paraesophageal hernia. - Normal stomach. Biopsied. - Duodenitis.  Path: A. STOMACH, BIOPSY:  - Gastric mucosa with features of reactive gastropathy and intestinal  metaplasia  - Negative for H. pylori on HE stain  - Negative for dysplasia or  malignancy   Last Colonoscopy:prior to 2013, patient believes it was normal   Past Medical History: Past Medical History:  Diagnosis Date   Addison disease (HCC)    Anxiety    COPD (chronic obstructive pulmonary disease) (HCC)    Depression    Dysrhythmia    Fibromyalgia    Hepatitis C    PONV (postoperative nausea and vomiting)     Past Surgical History: Past Surgical History:  Procedure Laterality Date   BIOPSY  03/03/2022   Procedure: BIOPSY;  Surgeon: Dolores Frame, MD;  Location: [facility];  Service: Gastroenterology;;   CHOLECYSTECTOMY     ESOPHAGOGASTRODUODENOSCOPY  11/23/2011   erosive/ulcerative reflux esophagitis, bulbar duodenitis.   ESOPHAGOGASTRODUODENOSCOPY (EGD) WITH PROPOFOL N/A 01/11/2021   Procedure: ESOPHAGOGASTRODUODENOSCOPY (EGD) WITH PROPOFOL;  Surgeon: Lanelle Bal, DO;  Location: [facility];  Service: Endoscopy;  Laterality: N/A;  possible dilation   ESOPHAGOGASTRODUODENOSCOPY (EGD) WITH PROPOFOL N/A 03/03/2022   Procedure: ESOPHAGOGASTRODUODENOSCOPY (EGD) WITH PROPOFOL;  Surgeon: Dolores Frame, MD;  Location: [facility];  Service: Gastroenterology;  Laterality: N/A;   INCISION AND DRAINAGE ABSCESS Left 05/31/2015   Procedure: INCISION AND DRAINAGE ABSCESS left foot;  Surgeon: Vickki Hearing, MD;  Location: [facility];  Service: Orthopedics;  Laterality: Left;   ORTHOPEDIC SURGERY     knee surgery   TUBAL LIGATION     TYMPANOSTOMY TUBE PLACEMENT      Family History: Family History  Problem Relation Age of Onset   Cancer Mother    Heart failure Sister    Heart failure Paternal Grandmother    Colon cancer  Neg Hx     Social History: Social History   Tobacco Use  Smoking Status Every Day   Packs/day: 0.50   Types: Cigarettes   Passive exposure: Current  Smokeless Tobacco Never  Tobacco Comments   1 pack a day.   Social History   Substance and Sexual Activity  Alcohol Use No   Social History    Substance and Sexual Activity  Drug Use No   Comment: In the past, she smoke cocaine and marijuana. No IV drugs.    Allergies: Allergies  Allergen Reactions   Asa [Aspirin] Other (See Comments)    G.I.Upset   Prednisone Itching   Tape     sore   Tylenol [Acetaminophen] Other (See Comments)    G.I. Upset    Medications: Current Outpatient Medications  Medication Sig Dispense Refill   albuterol (VENTOLIN HFA) 108 (90 Base) MCG/ACT inhaler Inhale 2 puffs into the lungs every 6 (six) hours as needed for wheezing or shortness of breath. 8 g 1   esomeprazole (NEXIUM) 40 MG capsule Take 1 capsule (40 mg total) by mouth 2 (two) times daily before a meal. 180 capsule 1   fexofenadine (ALLEGRA) 180 MG tablet Take 180 mg by mouth daily as needed for allergies or rhinitis.     mupirocin ointment (BACTROBAN) 2 % Apply 1 Application topically 2 (two) times daily. 22 g 0   ondansetron (ZOFRAN) 4 MG tablet Take 1 tablet (4 mg total) by mouth every 8 (eight) hours as needed for nausea or vomiting. 30 tablet 2   sucralfate (CARAFATE) 1 g tablet Take 1 tablet (1 g total) by mouth 4 (four) times daily -  with meals and at bedtime. 120 tablet 1   No current facility-administered medications for this visit.    Review of Systems: GENERAL: negative for malaise, night sweats HEENT: No changes in hearing or vision, no nose bleeds or other nasal problems. NECK: Negative for lumps, goiter, pain and significant neck swelling RESPIRATORY: Negative for cough, wheezing CARDIOVASCULAR: Negative for chest pain, leg swelling, palpitations, orthopnea GI: SEE HPI MUSCULOSKELETAL: Negative for joint pain or swelling, back pain, and muscle pain. SKIN: Negative for lesions, rash PSYCH: Negative for sleep disturbance, mood disorder and recent psychosocial stressors. HEMATOLOGY Negative for prolonged bleeding, bruising easily, and swollen nodes. ENDOCRINE: Negative for cold or heat intolerance, polyuria,  polydipsia and goiter. NEURO: negative for tremor, gait imbalance, syncope and seizures. The remainder of the review of systems is noncontributory.   Physical Exam: BP 124/76 (BP Location: Left Arm, Patient Position: Sitting, Cuff Size: Normal)   Pulse (!) 105   Temp 97.8 F (36.6 C) (Oral)   Ht 5' 1.5" (1.562 m)   Wt 156 lb 1.6 oz (70.8 kg)   BMI 29.02 kg/m  GENERAL: The patient is AO x3, in no acute distress. HEENT: Head is normocephalic and atraumatic. EOMI are intact. Mouth is well hydrated and without lesions. NECK: Supple. No masses LUNGS: Clear to auscultation. No presence of rhonchi/wheezing/rales. Adequate chest expansion HEART: RRR, normal s1 and s2. ABDOMEN: Soft, nontender, no guarding, no peritoneal signs, and nondistended. BS +. No masses. EXTREMITIES: Without any cyanosis, clubbing, rash, lesions or edema. NEUROLOGIC: AOx3, no focal motor deficit. SKIN: no jaundice, no rashes  Imaging/Labs: as above  I personally reviewed and interpreted the available labs, imaging and endoscopic files.  Impression and Plan: KAYAL MULA is a 51 y.o. female with PMH hepatitis C genotype 2B, COPD, Addison's disease, GERD, who presents for follow-up  of hepatitis C and esophagitis. Patient reports that she has presented som improvement of her symptoms after using PPI BID compliantly but still presenting some chest symptoms. The patient exprssed her frustration and the intention to have her symptoms controlled on a more permanent fashion. She would like to have an evaluation by surgery to have her hernia corrected and a possible Nissen fundoplication, which I consider adequate given the severity of her esophagitis.  Regarding her hepatitis C, we may consider treating this after she undergoes possible correction of hiatal hernia, as this may allow to be off PPI while taking DAA and avoid treatment failure. Would need to proceed with elastography in the future before starting treatment.  Patient agreed with this plan.  Finally, as she has not tolerated the colonoscopy prep, we will proceed with Cologuard screening for now,  -Continue esomeprazole 40 mg twice a day -Continue with Zofran as needed -Will refer to [facility] for hiatal hernia repair and possible Nissen fundoplication -Will discuss performing elastography in next appointment for evaluation of hepatitis C - treatment options limited due to medication interaction but this may be easier after hiatal hernia correction  -Proceed with Cologuard  All questions were answered.      Kathleen Blazing, MD Gastroenterology and Hepatology [facility] Gastroenterology

## 2022-04-06 ENCOUNTER — Encounter: Payer: Self-pay | Admitting: Internal Medicine

## 2022-04-06 ENCOUNTER — Ambulatory Visit: Payer: Medicare HMO | Admitting: Family Medicine

## 2022-04-19 ENCOUNTER — Other Ambulatory Visit: Payer: Self-pay

## 2022-04-21 ENCOUNTER — Ambulatory Visit: Payer: Medicare HMO | Admitting: Family Medicine

## 2022-04-21 ENCOUNTER — Encounter: Payer: Self-pay | Admitting: Family Medicine

## 2022-04-25 ENCOUNTER — Ambulatory Visit (HOSPITAL_BASED_OUTPATIENT_CLINIC_OR_DEPARTMENT_OTHER): Payer: Medicare PPO | Admitting: Psychiatry

## 2022-04-25 ENCOUNTER — Encounter (HOSPITAL_COMMUNITY): Payer: Self-pay | Admitting: Psychiatry

## 2022-04-25 VITALS — BP 165/90 | HR 82 | Temp 97.6°F | Ht 61.5 in | Wt 149.2 lb

## 2022-04-25 DIAGNOSIS — M797 Fibromyalgia: Secondary | ICD-10-CM

## 2022-04-25 DIAGNOSIS — F314 Bipolar disorder, current episode depressed, severe, without psychotic features: Secondary | ICD-10-CM

## 2022-04-25 DIAGNOSIS — F418 Other specified anxiety disorders: Secondary | ICD-10-CM | POA: Diagnosis not present

## 2022-04-25 DIAGNOSIS — F4310 Post-traumatic stress disorder, unspecified: Secondary | ICD-10-CM

## 2022-04-25 MED ORDER — PROPRANOLOL HCL 10 MG PO TABS: 10.0000 mg | ORAL_TABLET | Freq: Two times a day (BID) | ORAL | 1 refills | Status: DC | PRN

## 2022-04-25 MED ORDER — LAMOTRIGINE 25 MG PO TABS: ORAL_TABLET | ORAL | 0 refills | Status: AC
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
## 2022-04-26 ENCOUNTER — Encounter: Payer: Self-pay | Admitting: Surgery

## 2022-04-26 DIAGNOSIS — K449 Diaphragmatic hernia without obstruction or gangrene: Secondary | ICD-10-CM | POA: Diagnosis not present

## 2022-04-26 DIAGNOSIS — K219 Gastro-esophageal reflux disease without esophagitis: Secondary | ICD-10-CM | POA: Diagnosis not present

## 2022-04-27 ENCOUNTER — Encounter (INDEPENDENT_AMBULATORY_CARE_PROVIDER_SITE_OTHER): Payer: Self-pay

## 2022-04-27 LAB — COLOGUARD

## 2022-05-09 ENCOUNTER — Ambulatory Visit (HOSPITAL_COMMUNITY): Payer: Medicare HMO | Admitting: Clinical

## 2022-05-09 ENCOUNTER — Encounter (HOSPITAL_COMMUNITY): Payer: Self-pay

## 2022-05-10 ENCOUNTER — Other Ambulatory Visit: Payer: Self-pay

## 2022-05-19 ENCOUNTER — Other Ambulatory Visit (INDEPENDENT_AMBULATORY_CARE_PROVIDER_SITE_OTHER): Payer: Self-pay

## 2022-05-19 DIAGNOSIS — Z1211 Encounter for screening for malignant neoplasm of colon: Secondary | ICD-10-CM

## 2022-05-24 ENCOUNTER — Ambulatory Visit: Payer: Self-pay | Admitting: Surgery

## 2022-05-24 NOTE — Progress Notes (Signed)
Please place orders for PAT appointment scheduled 05/25/22.

## 2022-05-24 NOTE — Progress Notes (Signed)
COVID Vaccine Completed:  Date of COVID positive in last 90 days:  PCP - Alvira Monday, FNP Cardiologist -   Chest x-ray -  EKG -  Stress Test -  ECHO - scheduled 06/02/22 Cardiac Cath -  Pacemaker/ICD device last checked: Spinal Cord Stimulator:  Bowel Prep -   Sleep Study -  CPAP -   Fasting Blood Sugar -  Checks Blood Sugar _____ times a day  Last dose of GLP1 agonist-  N/A GLP1 instructions:  N/A   Last dose of SGLT-2 inhibitors-  N/A SGLT-2 instructions: N/A   Blood Thinner Instructions: Aspirin Instructions: Last Dose:  Activity level:  Can go up a flight of stairs and perform activities of daily living without stopping and without symptoms of chest pain or shortness of breath.  Able to exercise without symptoms  Unable to go up a flight of stairs without symptoms of     Anesthesia review: mitral regurgitation, COPD, hep c, asthma, heart murmur  Patient denies shortness of breath, fever, cough and chest pain at PAT appointment  Patient verbalized understanding of instructions that were given to them at the PAT appointment. Patient was also instructed that they will need to review over the PAT instructions again at home before surgery.

## 2022-05-24 NOTE — Patient Instructions (Signed)
SURGICAL WAITING ROOM VISITATION  Patients having surgery or a procedure may have no more than 2 support people in the waiting area - these visitors may rotate.    Children under the age of 57 must have an adult with them who is not the patient.  Due to an increase in RSV and influenza rates and associated hospitalizations, children ages 58 and under may not visit patients in [place].  If the patient needs to stay at the hospital during part of their recovery, the visitor guidelines for inpatient rooms apply. Pre-op nurse will coordinate an appropriate time for 1 support person to accompany patient in pre-op.  This support person may not rotate.    Please refer to the [facility] website for the visitor guidelines for Inpatients (after your surgery is over and you are in a regular room).    Your procedure is scheduled on: 06/07/22   Report to [facility] Main Entrance    Report to admitting at 9:15 AM   Call this number if you have problems the morning of surgery [phone]   Do not eat food :After Midnight.   After Midnight you may have the following liquids until ______ AM/ PM DAY OF SURGERY  Water Non-Citrus Juices (without pulp, NO RED-Apple, White grape, White cranberry) Black Coffee (NO MILK/CREAM OR CREAMERS, sugar ok)  Clear Tea (NO MILK/CREAM OR CREAMERS, sugar ok) regular and decaf                             Plain Jell-O (NO RED)                                           Fruit ices (not with fruit pulp, NO RED)                                     Popsicles (NO RED)                                                               Sports drinks like Gatorade (NO RED)              Drink 2 Ensure/G2 drinks AT 10:00 PM the night before surgery.        The day of surgery:  Drink ONE (1) Pre-Surgery Clear Ensure or G2 at AM the morning of surgery. Drink in one sitting. Do not sip.  This drink was given to you during your hospital  pre-op appointment  visit. Nothing else to drink after completing the  Pre-Surgery Clear Ensure or G2.          If you have questions, please contact your surgeon's office.   FOLLOW BOWEL PREP AND ANY ADDITIONAL PRE OP INSTRUCTIONS YOU RECEIVED FROM YOUR SURGEON'S OFFICE!!!     Oral Hygiene is also important to reduce your risk of infection.  Remember - BRUSH YOUR TEETH THE MORNING OF SURGERY WITH YOUR REGULAR TOOTHPASTE  DENTURES WILL BE REMOVED PRIOR TO SURGERY PLEASE DO NOT APPLY "Poly grip" OR ADHESIVES!!!   Do NOT smoke after Midnight   Take these medicines the morning of surgery with A SIP OF WATER: Inhalers, Nexium, Allegra, Lamictal, Zofran, Propranolol   DO NOT TAKE ANY ORAL DIABETIC MEDICATIONS DAY OF YOUR SURGERY  Bring CPAP mask and tubing day of surgery.                              You may not have any metal on your body including hair pins, jewelry, and body piercing             Do not wear make-up, lotions, powders, perfumes, or deodorant  Do not wear nail polish including gel and S&S, artificial/acrylic nails, or any other type of covering on natural nails including finger and toenails. If you have artificial nails, gel coating, etc. that needs to be removed by a nail salon please have this removed prior to surgery or surgery may need to be canceled/ delayed if the surgeon/ anesthesia feels like they are unable to be safely monitored.   Do not shave  48 hours prior to surgery.    Do not bring valuables to the hospital. [place].   Contacts, glasses, dentures or bridgework may not be worn into surgery.   Bring small overnight bag day of surgery.   DO NOT [place]. PHARMACY WILL DISPENSE MEDICATIONS LISTED ON YOUR MEDICATION LIST TO YOU DURING YOUR ADMISSION [place]!   Special Instructions: Bring a copy of your healthcare power of attorney and living will  documents the day of surgery if you haven't scanned them before.              Please read over the following fact sheets you were given: IF [place] [phone] Schneiders   If you received a COVID test during your pre-op visit  it is requested that you wear a mask when out in public, stay away from anyone that may not be feeling well and notify your surgeon if you develop symptoms. If you test positive for Covid or have been in contact with anyone that has tested positive in the last 10 days please notify you surgeon.    [place] - Preparing for Surgery Before surgery, you can play an important role.  Because skin is not sterile, your skin needs to be as free of germs as possible.  You can reduce the number of germs on your skin by washing with CHG (chlorahexidine gluconate) soap before surgery.  CHG is an antiseptic cleaner which kills germs and bonds with the skin to continue killing germs even after washing. Please DO NOT use if you have an allergy to CHG or antibacterial soaps.  If your skin becomes reddened/irritated stop using the CHG and inform your nurse when you arrive at Short Stay. Do not shave (including legs and underarms) for at least 48 hours prior to the first CHG shower.  You may shave your face/neck.  Please follow these instructions carefully:  1.  Shower with CHG Soap the night before surgery and the  morning of surgery.  2.  If you choose  to wash your hair, wash your hair first as usual with your normal  shampoo.  3.  After you shampoo, rinse your hair and body thoroughly to remove the shampoo.                             4.  Use CHG as you would any other liquid soap.  You can apply chg directly to the skin and wash.  Gently with a scrungie or clean washcloth.  5.  Apply the CHG Soap to your body ONLY FROM THE NECK DOWN.   Do   not use on face/ open                           Wound or open sores. Avoid contact with eyes,  ears mouth and   genitals (private parts).                       Wash face,  Genitals (private parts) with your normal soap.             6.  Wash thoroughly, paying special attention to the area where your    surgery  will be performed.  7.  Thoroughly rinse your body with warm water from the neck down.  8.  DO NOT shower/wash with your normal soap after using and rinsing off the CHG Soap.                9.  Pat yourself dry with a clean towel.            10.  Wear clean pajamas.            11.  Place clean sheets on your bed the night of your first shower and do not  sleep with pets. Day of Surgery : Do not apply any lotions/deodorants the morning of surgery.  Please wear clean clothes to the hospital/surgery center.  FAILURE TO FOLLOW THESE INSTRUCTIONS MAY RESULT IN THE CANCELLATION OF YOUR SURGERY  PATIENT SIGNATURE_________________________________  NURSE SIGNATURE__________________________________  ________________________________________________________________________

## 2022-05-25 ENCOUNTER — Encounter (HOSPITAL_COMMUNITY)
Admission: RE | Admit: 2022-05-25 | Discharge: 2022-05-25 | Disposition: A | Discharge status: Home / Self Care | Payer: Medicare HMO | Source: Ambulatory Visit | Attending: Anesthesiology | Admitting: Anesthesiology

## 2022-05-25 ENCOUNTER — Encounter (HOSPITAL_COMMUNITY): Payer: Self-pay

## 2022-05-25 ENCOUNTER — Ambulatory Visit (HOSPITAL_COMMUNITY): Payer: Self-pay | Admitting: Psychiatry

## 2022-05-25 DIAGNOSIS — I080 Rheumatic disorders of both mitral and aortic valves: Secondary | ICD-10-CM

## 2022-05-25 DIAGNOSIS — B1920 Unspecified viral hepatitis C without hepatic coma: Secondary | ICD-10-CM
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
## 2022-05-25 NOTE — Progress Notes (Addendum)
Called patient cell phone at [place] regarding PAT appointment. Phone went straight to voicemail and box was full. Called home phone number with no answer and left a voicemail with call back number. Tried home phone 10 mins later and patient's mother-in-law answered stating patient was still sleeping. Asked if patient can call when she wakes up. Mother-in-law stated she will give patient message. Called home phone again at [phone] with no answer.

## 2022-05-30 ENCOUNTER — Encounter (HOSPITAL_COMMUNITY): Payer: Self-pay

## 2022-05-30 ENCOUNTER — Encounter (HOSPITAL_COMMUNITY)
Admission: RE | Admit: 2022-05-30 | Discharge: 2022-05-30 | Disposition: A | Discharge status: Home / Self Care | Payer: Medicare HMO | Source: Ambulatory Visit | Attending: Surgery | Admitting: Surgery

## 2022-05-30 DIAGNOSIS — I080 Rheumatic disorders of both mitral and aortic valves: Secondary | ICD-10-CM | POA: Diagnosis not present

## 2022-05-30 DIAGNOSIS — B1920 Unspecified viral hepatitis C without hepatic coma: Secondary | ICD-10-CM | POA: Insufficient documentation

## 2022-05-30 DIAGNOSIS — Z01818 Encounter for other preprocedural examination: Secondary | ICD-10-CM | POA: Diagnosis present

## 2022-05-30 HISTORY — DX: Gastro-esophageal reflux disease without esophagitis: K21.9

## 2022-05-30 HISTORY — DX: Pneumonia, unspecified organism: J18.9

## 2022-05-30 HISTORY — DX: Anemia, unspecified: D64.9

## 2022-05-30 HISTORY — DX: Headache, unspecified: R51.9

## 2022-05-30 LAB — COMPREHENSIVE METABOLIC PANEL
ALT: 18 U/L (ref 0–44)
AST: 24 U/L (ref 15–41)
Albumin: 3.3 g/dL — ABNORMAL LOW (ref 3.5–5.0)
Alkaline Phosphatase: 79 U/L (ref 38–126)
Anion gap: 7 (ref 5–15)
BUN: 9 mg/dL (ref 6–20)
CO2: 27 mmol/L (ref 22–32)
Calcium: 8.8 mg/dL — ABNORMAL LOW (ref 8.9–10.3)
Chloride: 103 mmol/L (ref 98–111)
Creatinine, Ser: 0.79 mg/dL (ref 0.44–1.00)
GFR, Estimated: >60 mL/min (ref >60)
Glucose, Bld: 101 mg/dL — ABNORMAL HIGH (ref 70–99)
Potassium: 3.3 mmol/L — ABNORMAL LOW (ref 3.5–5.1)
Sodium: 137 mmol/L (ref 135–145)
Total Bilirubin: 0.6 mg/dL (ref 0.3–1.2)
Total Protein: 6.7 g/dL (ref 6.5–8.1)

## 2022-05-30 LAB — CBC
HCT: 36.8 % (ref 36.0–46.0)
Hemoglobin: 12 g/dL (ref 12.0–15.0)
MCH: 26.9 pg (ref 26.0–34.0)
MCHC: 32.6 g/dL (ref 30.0–36.0)
MCV: 82.5 fL (ref 80.0–100.0)
Platelets: 105 10*3/uL — ABNORMAL LOW (ref 150–400)
RBC: 4.46 MIL/uL (ref 3.87–5.11)
RDW: 15.1 % (ref 11.5–15.5)
WBC: 5.6 10*3/uL (ref 4.0–10.5)
nRBC: 0 % (ref 0.0–0.2)

## 2022-05-30 NOTE — Patient Instructions (Signed)
SURGICAL WAITING ROOM VISITATION  Patients having surgery or a procedure may have no more than 2 support people in the waiting area - these visitors may rotate.    Children under the age of 61 must have an adult with them who is not the patient.  Due to an increase in RSV and influenza rates and associated hospitalizations, children ages 38 and under may not visit patients in [place].  If the patient needs to stay at the hospital during part of their recovery, the visitor guidelines for inpatient rooms apply. Pre-op nurse will coordinate an appropriate time for 1 support person to accompany patient in pre-op.  This support person may not rotate.    Please refer to the [facility] website for the visitor guidelines for Inpatients (after your surgery is over and you are in a regular room).    Your procedure is scheduled on: 06/07/22   Report to [facility] Main Entrance    Report to admitting at 9:15 AM   Call this number if you have problems the morning of surgery [phone]   Do not eat food :After Midnight.   After Midnight you may have the following liquids until 8:30 AM DAY OF SURGERY  Water Non-Citrus Juices (without pulp, NO RED-Apple, White grape, White cranberry) Black Coffee (NO MILK/CREAM OR CREAMERS, sugar ok)  Clear Tea (NO MILK/CREAM OR CREAMERS, sugar ok) regular and decaf                             Plain Jell-O (NO RED)                                           Fruit ices (not with fruit pulp, NO RED)                                     Popsicles (NO RED)                                                               Sports drinks like Gatorade (NO RED)          If you have questions, please contact your surgeon's office.   FOLLOW BOWEL PREP AND ANY ADDITIONAL PRE OP INSTRUCTIONS YOU RECEIVED FROM YOUR SURGEON'S OFFICE!!!     Oral Hygiene is also important to reduce your risk of infection.                                    Remember - BRUSH  YOUR TEETH THE MORNING OF SURGERY WITH YOUR REGULAR TOOTHPASTE  DENTURES WILL BE REMOVED PRIOR TO SURGERY PLEASE DO NOT APPLY "Poly grip" OR ADHESIVES!!!   Do NOT smoke after Midnight   Take these medicines the morning of surgery with A SIP OF WATER: Inhalers, Nexium, Allegra, Lamictal, Zofran, Propranolol  You may not have any metal on your body including hair pins, jewelry, and body piercing             Do not wear make-up, lotions, powders, perfumes, or deodorant  Do not wear nail polish including gel and S&S, artificial/acrylic nails, or any other type of covering on natural nails including finger and toenails. If you have artificial nails, gel coating, etc. that needs to be removed by a nail salon please have this removed prior to surgery or surgery may need to be canceled/ delayed if the surgeon/ anesthesia feels like they are unable to be safely monitored.   Do not shave  48 hours prior to surgery.    Do not bring valuables to the hospital. [place].   Contacts, glasses, dentures or bridgework may not be worn into surgery.   Bring small overnight bag day of surgery.   DO NOT [place]. PHARMACY WILL DISPENSE MEDICATIONS LISTED ON YOUR MEDICATION LIST TO YOU DURING YOUR ADMISSION [place]!              Please read over the following fact sheets you were given: IF [place] [phone] Schneiders    If you received a COVID test during your pre-op visit  it is requested that you wear a mask when out in public, stay away from anyone that may not be feeling well and notify your surgeon if you develop symptoms. If you test positive for Covid or have been in contact with anyone that has tested positive in the last 10 days please notify you surgeon.    [place] - Preparing for Surgery Before surgery, you can play an  important role.  Because skin is not sterile, your skin needs to be as free of germs as possible.  You can reduce the number of germs on your skin by washing with CHG (chlorahexidine gluconate) soap before surgery.  CHG is an antiseptic cleaner which kills germs and bonds with the skin to continue killing germs even after washing. Please DO NOT use if you have an allergy to CHG or antibacterial soaps.  If your skin becomes reddened/irritated stop using the CHG and inform your nurse when you arrive at Short Stay. Do not shave (including legs and underarms) for at least 48 hours prior to the first CHG shower.  You may shave your face/neck.  Please follow these instructions carefully:  1.  Shower with CHG Soap the night before surgery and the  morning of surgery.  2.  If you choose to wash your hair, wash your hair first as usual with your normal  shampoo.  3.  After you shampoo, rinse your hair and body thoroughly to remove the shampoo.                             4.  Use CHG as you would any other liquid soap.  You can apply chg directly to the skin and wash.  Gently with a scrungie or clean washcloth.  5.  Apply the CHG Soap to your body ONLY FROM THE NECK DOWN.   Do   not use on face/ open  Wound or open sores. Avoid contact with eyes, ears mouth and   genitals (private parts).                       Wash face,  Genitals (private parts) with your normal soap.             6.  Wash thoroughly, paying special attention to the area where your    surgery  will be performed.  7.  Thoroughly rinse your body with warm water from the neck down.  8.  DO NOT shower/wash with your normal soap after using and rinsing off the CHG Soap.                9.  Pat yourself dry with a clean towel.            10.  Wear clean pajamas.            11.  Place clean sheets on your bed the night of your first shower and do not  sleep with pets. Day of Surgery : Do not apply any lotions/deodorants the  morning of surgery.  Please wear clean clothes to the hospital/surgery center.  FAILURE TO FOLLOW THESE INSTRUCTIONS MAY RESULT IN THE CANCELLATION OF YOUR SURGERY  PATIENT SIGNATURE_________________________________  NURSE SIGNATURE__________________________________  ________________________________________________________________________

## 2022-05-30 NOTE — Progress Notes (Signed)
  COVID Vaccine Completed: no   Date of COVID positive in last 90 days: no   PCP - Madalyn Rob, MD Cardiologist - n/a   Chest x-ray - n/a EKG - 05/30/22 Epic/chart Stress Test - n/a ECHO - scheduled 06/02/22 Cardiac Cath - n/a Pacemaker/ICD device last checked: n/a Spinal Cord Stimulator: n/a   Bowel Prep - no   Sleep Study - n/a CPAP -    Fasting Blood Sugar - n/a Checks Blood Sugar _____ times a day   Last dose of GLP1 agonist-  N/A GLP1 instructions:  N/A   Last dose of SGLT-2 inhibitors-  N/A SGLT-2 instructions: N/A     Blood Thinner Instructions: n/a Aspirin Instructions: Last Dose:   Activity level: Can go up a flight of stairs and perform activities of daily living without stopping and without symptoms of chest pain or shortness of breath.   Anesthesia review: mitral regurgitation, COPD, hep c, asthma, heart murmur   Patient denies shortness of breath, fever, cough and chest pain at PAT appointment   Patient verbalized understanding of instructions that were given to them at the PAT appointment. Patient was also instructed that they will need to review over the PAT instructions again at home before surgery.

## 2022-05-31 ENCOUNTER — Encounter (HOSPITAL_COMMUNITY): Payer: Self-pay | Admitting: Physician Assistant

## 2022-06-02 ENCOUNTER — Ambulatory Visit (HOSPITAL_COMMUNITY): Admission: RE | Admit: 2022-06-02 | Payer: Medicare HMO | Source: Ambulatory Visit

## 2022-06-03 NOTE — Progress Notes (Signed)
Anesthesia Chart Review   Case: [id number] Date/Time: 06/07/22 1115   Procedure: XI ROBOTIC ASSISTED HIATAL HERNIA REPAIR WITH POSSIBLE MESH   Anesthesia type: General   Pre-op diagnosis: HIATAL HERNIA   Location[facility] ROOM 02 / [facility]   Surgeons: Felicie Morn, MD       DISCUSSION:51 y.o. smoker with h/o PONV, COPD, hiatal hernia scheduled for above procedure 06/07/2022 with Dr. Louanna Raw.   Pt last seen by PCP 03/07/2022. Per PCP notes 3/6 systolic murmer noted, lower extremity edema. Echo ordered.  Pt no showed for Echo on 06/02/22. Discussed with Dr. Robby Sermon office, will need echo before proceeding.    VS: BP 122/71   Pulse 91   Temp 36.5 C (Oral)   Resp 16   Ht '5\' 1"'$  (1.549 m)   Wt 64.9 kg   SpO2 99%   BMI 27.02 kg/m   PROVIDERS: Lyndal Pulley, MD is PCP    LABS: Labs reviewed: Acceptable for surgery. (all labs ordered are listed, but only abnormal results are displayed)  Labs Reviewed  CBC - Abnormal; Notable for the following components:      Result Value   Platelets 105 (*)    All other components within normal limits  COMPREHENSIVE METABOLIC PANEL - Abnormal; Notable for the following components:   Potassium 3.3 (*)    Glucose, Bld 101 (*)    Calcium 8.8 (*)    Albumin 3.3 (*)    All other components within normal limits     IMAGES:   EKG:   CV:  Past Medical History:  Diagnosis Date   Addison disease ([place])    Anemia    Anxiety    COPD (chronic obstructive pulmonary disease) ([facility])    Depression    Dysrhythmia    Fibromyalgia    GERD (gastroesophageal reflux disease)    Headache    Hepatitis C    Pneumonia    PONV (postoperative nausea and vomiting)     Past Surgical History:  Procedure Laterality Date   BIOPSY  03/03/2022   Procedure: BIOPSY;  Surgeon: Harvel Quale, MD;  [facility];  Service: Gastroenterology;;   CHOLECYSTECTOMY     ESOPHAGOGASTRODUODENOSCOPY  11/23/2011    erosive/ulcerative reflux esophagitis, bulbar duodenitis.   ESOPHAGOGASTRODUODENOSCOPY (EGD) WITH PROPOFOL N/A 01/11/2021   Procedure: ESOPHAGOGASTRODUODENOSCOPY (EGD) WITH PROPOFOL;  Surgeon: Eloise Harman, DO;  [facility];  Service: Endoscopy;  Laterality: N/A;  possible dilation   ESOPHAGOGASTRODUODENOSCOPY (EGD) WITH PROPOFOL N/A 03/03/2022   Procedure: ESOPHAGOGASTRODUODENOSCOPY (EGD) WITH PROPOFOL;  Surgeon: Harvel Quale, MD;  [facility];  Service: Gastroenterology;  Laterality: N/A;   INCISION AND DRAINAGE ABSCESS Left 05/31/2015   Procedure: INCISION AND DRAINAGE ABSCESS left foot;  Surgeon: Carole Civil, MD;  Location: [facility];  Service: Orthopedics;  Laterality: Left;   ORTHOPEDIC SURGERY     knee surgery   TUBAL LIGATION     TYMPANOSTOMY TUBE PLACEMENT      MEDICATIONS:  albuterol (VENTOLIN HFA) 108 (90 Base) MCG/ACT inhaler   esomeprazole (NEXIUM) 40 MG capsule   fexofenadine (ALLEGRA) 180 MG tablet   lamoTRIgine (LAMICTAL) 25 MG tablet   mupirocin ointment (BACTROBAN) 2 %   ondansetron (ZOFRAN) 4 MG tablet   propranolol (INDERAL) 10 MG tablet   sucralfate (CARAFATE) 1 g tablet   No current facility-administered medications for this encounter.     Konrad Felix Ward, PA-C [facility] Pre-Surgical Testing [phone]

## 2022-06-07 ENCOUNTER — Encounter (HOSPITAL_COMMUNITY): Admission: RE | Payer: Self-pay | Source: Ambulatory Visit

## 2022-06-07 ENCOUNTER — Ambulatory Visit (HOSPITAL_COMMUNITY): Admission: RE | Admit: 2022-06-07 | Payer: Medicare HMO | Source: Ambulatory Visit | Admitting: Surgery

## 2022-06-07 SURGERY — REPAIR, HERNIA, HIATAL, ROBOT-ASSISTED
Anesthesia: General

## 2022-06-21 LAB — COLOGUARD: COLOGUARD: POSITIVE — AB

## 2022-06-22 ENCOUNTER — Encounter: Payer: Self-pay | Admitting: Internal Medicine

## 2022-06-22 ENCOUNTER — Encounter (INDEPENDENT_AMBULATORY_CARE_PROVIDER_SITE_OTHER): Payer: Self-pay | Admitting: *Deleted

## 2022-06-22 DIAGNOSIS — R195 Other fecal abnormalities: Secondary | ICD-10-CM | POA: Insufficient documentation

## 2022-06-22 NOTE — Progress Notes (Signed)
Result letter mailed to patient.

## 2022-06-29 ENCOUNTER — Ambulatory Visit (HOSPITAL_BASED_OUTPATIENT_CLINIC_OR_DEPARTMENT_OTHER): Payer: Medicare HMO | Admitting: Psychiatry

## 2022-06-29 ENCOUNTER — Encounter (HOSPITAL_COMMUNITY): Payer: Self-pay | Admitting: Psychiatry

## 2022-06-29 VITALS — BP 121/71 | HR 80 | Ht 61.0 in | Wt 136.0 lb

## 2022-06-29 DIAGNOSIS — F314 Bipolar disorder, current episode depressed, severe, without psychotic features: Secondary | ICD-10-CM

## 2022-06-29 DIAGNOSIS — M797 Fibromyalgia: Secondary | ICD-10-CM

## 2022-06-29 DIAGNOSIS — F4310 Post-traumatic stress disorder, unspecified: Secondary | ICD-10-CM | POA: Diagnosis not present

## 2022-06-29 MED ORDER — TRAZODONE HCL 50 MG PO TABS: 50.0000 mg | ORAL_TABLET | Freq: Every evening | ORAL | 2 refills | Status: AC | PRN

## 2022-06-29 MED ORDER — OXCARBAZEPINE 150 MG PO TABS: 150.0000 mg | ORAL_TABLET | Freq: Two times a day (BID) | ORAL | 1 refills | Status: AC

## 2022-06-29 MED ORDER — PROPRANOLOL HCL 10 MG PO TABS: 10.0000 mg | ORAL_TABLET | Freq: Two times a day (BID) | ORAL | 1 refills | Status: AC | PRN
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
## 2022-06-30 ENCOUNTER — Ambulatory Visit (HOSPITAL_COMMUNITY): Admission: RE | Admit: 2022-06-30 | Payer: Medicare HMO | Source: Ambulatory Visit

## 2022-07-18 ENCOUNTER — Ambulatory Visit (INDEPENDENT_AMBULATORY_CARE_PROVIDER_SITE_OTHER): Payer: Medicare HMO | Admitting: Gastroenterology

## 2022-07-18 ENCOUNTER — Encounter (INDEPENDENT_AMBULATORY_CARE_PROVIDER_SITE_OTHER): Payer: Self-pay | Admitting: Gastroenterology

## 2022-07-29 ENCOUNTER — Ambulatory Visit (HOSPITAL_COMMUNITY): Payer: Medicare HMO

## 2022-08-10 ENCOUNTER — Ambulatory Visit (HOSPITAL_COMMUNITY): Payer: Medicare HMO | Admitting: Psychiatry
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
## 2022-09-20 ENCOUNTER — Ambulatory Visit (INDEPENDENT_AMBULATORY_CARE_PROVIDER_SITE_OTHER): Payer: Medicare HMO | Admitting: Gastroenterology

## 2022-09-20 ENCOUNTER — Encounter (INDEPENDENT_AMBULATORY_CARE_PROVIDER_SITE_OTHER): Payer: Self-pay | Admitting: Gastroenterology

## 2024-01-17 ENCOUNTER — Encounter (INDEPENDENT_AMBULATORY_CARE_PROVIDER_SITE_OTHER): Payer: Self-pay | Admitting: Gastroenterology
